# Patient Record
Sex: Female | Born: 1995 | Race: White | Hispanic: No | Marital: Married | Smoking: Never smoker
Health system: Southern US, Community
[De-identification: ages and names within clinical notes are randomized; demographics above are authoritative.]

## PROBLEM LIST (undated history)

## (undated) DIAGNOSIS — E079 Disorder of thyroid, unspecified: Secondary | ICD-10-CM

## (undated) DIAGNOSIS — D649 Anemia, unspecified: Secondary | ICD-10-CM

## (undated) HISTORY — PX: APPENDECTOMY: SHX54

## (undated) HISTORY — PX: TONSILLECTOMY: SUR1361

---

## 2018-11-19 LAB — OB RESULTS CONSOLE GC/CHLAMYDIA
Chlamydia: NEGATIVE
Gonorrhea: NEGATIVE

## 2018-11-19 LAB — OB RESULTS CONSOLE ABO/RH: RH Type: POSITIVE

## 2018-11-19 LAB — OB RESULTS CONSOLE ANTIBODY SCREEN: Antibody Screen: NEGATIVE

## 2018-11-19 LAB — OB RESULTS CONSOLE RUBELLA ANTIBODY, IGM: Rubella: IMMUNE

## 2018-11-19 LAB — OB RESULTS CONSOLE HIV ANTIBODY (ROUTINE TESTING): HIV: NONREACTIVE

## 2018-11-19 LAB — OB RESULTS CONSOLE HEPATITIS B SURFACE ANTIGEN: Hepatitis B Surface Ag: NEGATIVE

## 2018-11-19 LAB — OB RESULTS CONSOLE RPR: RPR: NONREACTIVE

## 2019-05-14 LAB — OB RESULTS CONSOLE GBS: GBS: NEGATIVE

## 2019-05-21 ENCOUNTER — Other Ambulatory Visit: Payer: Self-pay | Admitting: Obstetrics and Gynecology

## 2019-05-22 ENCOUNTER — Encounter (HOSPITAL_COMMUNITY): Payer: Self-pay | Admitting: *Deleted

## 2019-05-22 NOTE — Patient Instructions (Signed)
Bruce Johannes  05/22/2019   Your procedure is scheduled on:  05/28/2019  Arrive at [place] at Entrance C on [address] at [facility]  and [employer]. You are invited to use the FREE valet parking or use the Visitor's parking deck.  Pick up the phone at the desk and dial [phone].  Call this number if you have problems the morning of surgery: [phone]  Remember:   Do not eat food:(After Midnight) Desps de medianoche.  Do not drink clear liquids: (After Midnight) Desps de medianoche.  Take these medicines the morning of surgery with A SIP OF WATER:  none   Do not wear jewelry, make-up or nail polish.  Do not wear lotions, powders, or perfumes. Do not wear deodorant.  Do not shave 48 hours prior to surgery.  Do not bring valuables to the hospital.  [facility] is not   responsible for any belongings or valuables brought to the hospital.  Contacts, dentures or bridgework may not be worn into surgery.  Leave suitcase in the car. After surgery it may be brought to your room.  For patients admitted to the hospital, checkout time is 11:00 AM the day of              discharge.      Please read over the following fact sheets that you were given:     Preparing for Surgery

## 2019-05-26 ENCOUNTER — Other Ambulatory Visit (HOSPITAL_COMMUNITY)
Admission: RE | Admit: 2019-05-26 | Discharge: 2019-05-26 | Disposition: A | Discharge status: Home / Self Care | Payer: 59 | Source: Ambulatory Visit | Attending: Obstetrics and Gynecology | Admitting: Obstetrics and Gynecology

## 2019-05-26 ENCOUNTER — Other Ambulatory Visit: Payer: Self-pay

## 2019-05-26 DIAGNOSIS — Z20822 Contact with and (suspected) exposure to covid-19: Secondary | ICD-10-CM | POA: Insufficient documentation

## 2019-05-26 LAB — CBC
HCT: 29.4 % — ABNORMAL LOW (ref 36.0–46.0)
Hemoglobin: 9.6 g/dL — ABNORMAL LOW (ref 12.0–15.0)
MCH: 26.6 pg (ref 26.0–34.0)
MCHC: 32.7 g/dL (ref 30.0–36.0)
MCV: 81.4 fL (ref 80.0–100.0)
Platelets: 173 10*3/uL (ref 150–400)
RBC: 3.61 MIL/uL — ABNORMAL LOW (ref 3.87–5.11)
RDW: 13 % (ref 11.5–15.5)
WBC: 8.3 10*3/uL (ref 4.0–10.5)
nRBC: 0 % (ref 0.0–0.2)

## 2019-05-26 LAB — TYPE AND SCREEN
ABO/RH(D): O POS
Antibody Screen: NEGATIVE

## 2019-05-26 LAB — SARS CORONAVIRUS 2 (TAT 6-24 HRS): SARS Coronavirus 2: NEGATIVE

## 2019-05-26 LAB — RPR: RPR Ser Ql: NONREACTIVE

## 2019-05-27 LAB — ABO/RH: ABO/RH(D): O POS

## 2019-05-28 ENCOUNTER — Inpatient Hospital Stay (HOSPITAL_COMMUNITY): Payer: 59 | Admitting: Anesthesiology

## 2019-05-28 ENCOUNTER — Inpatient Hospital Stay (HOSPITAL_COMMUNITY)
Admission: RE | Admit: 2019-05-28 | Discharge: 2019-05-31 | DRG: 787 | Disposition: A | Discharge status: Home / Self Care | Payer: 59 | Attending: Obstetrics and Gynecology | Admitting: Obstetrics and Gynecology

## 2019-05-28 ENCOUNTER — Other Ambulatory Visit: Payer: Self-pay

## 2019-05-28 ENCOUNTER — Encounter (HOSPITAL_COMMUNITY): Payer: Self-pay | Admitting: Obstetrics and Gynecology

## 2019-05-28 ENCOUNTER — Encounter (HOSPITAL_COMMUNITY)
Admission: RE | Disposition: A | Discharge status: Home / Self Care | Payer: Self-pay | Source: Home / Self Care | Attending: Obstetrics and Gynecology

## 2019-05-28 DIAGNOSIS — O9081 Anemia of the puerperium: Secondary | ICD-10-CM | POA: Diagnosis not present

## 2019-05-28 DIAGNOSIS — O9902 Anemia complicating childbirth: Secondary | ICD-10-CM | POA: Diagnosis present

## 2019-05-28 DIAGNOSIS — O30033 Twin pregnancy, monochorionic/diamniotic, third trimester: Secondary | ICD-10-CM | POA: Diagnosis present

## 2019-05-28 DIAGNOSIS — Z98891 History of uterine scar from previous surgery: Secondary | ICD-10-CM

## 2019-05-28 DIAGNOSIS — Z3A36 36 weeks gestation of pregnancy: Secondary | ICD-10-CM

## 2019-05-28 DIAGNOSIS — O365930 Maternal care for other known or suspected poor fetal growth, third trimester, not applicable or unspecified: Secondary | ICD-10-CM | POA: Diagnosis present

## 2019-05-28 DIAGNOSIS — D62 Acute posthemorrhagic anemia: Secondary | ICD-10-CM | POA: Diagnosis not present

## 2019-05-28 DIAGNOSIS — O321XX2 Maternal care for breech presentation, fetus 2: Secondary | ICD-10-CM | POA: Diagnosis present

## 2019-05-28 DIAGNOSIS — O3090 Multiple gestation, unspecified, unspecified trimester: Secondary | ICD-10-CM | POA: Diagnosis present

## 2019-05-28 DIAGNOSIS — Z20822 Contact with and (suspected) exposure to covid-19: Secondary | ICD-10-CM | POA: Diagnosis present

## 2019-05-28 DIAGNOSIS — O365932 Maternal care for other known or suspected poor fetal growth, third trimester, fetus 2: Secondary | ICD-10-CM | POA: Diagnosis present

## 2019-05-28 DIAGNOSIS — O321XX1 Maternal care for breech presentation, fetus 1: Secondary | ICD-10-CM | POA: Diagnosis present

## 2019-05-28 SURGERY — Surgical Case
Anesthesia: Spinal | Site: Abdomen | Wound class: Clean Contaminated

## 2019-05-28 MED ORDER — SIMETHICONE 80 MG PO CHEW: 80.0000 mg | CHEWABLE_TABLET | ORAL | Status: DC | PRN

## 2019-05-28 MED ORDER — NALOXONE HCL 0.4 MG/ML IJ SOLN: 0.4000 mg | INTRAMUSCULAR | Status: DC | PRN

## 2019-05-28 MED ORDER — IBUPROFEN 800 MG PO TABS
800.0000 mg | ORAL_TABLET | Freq: Four times a day (QID) | ORAL | Status: DC
  Administered 2019-05-29 – 2019-05-31 (×8): 800 mg via ORAL
  Filled 2019-05-28 (×8): qty 1

## 2019-05-28 MED ORDER — PRENATAL MULTIVITAMIN CH
1.0000 | ORAL_TABLET | Freq: Every day | ORAL | Status: DC
  Administered 2019-05-29 – 2019-05-31 (×3): 1 via ORAL
  Filled 2019-05-28 (×4): qty 1

## 2019-05-28 MED ORDER — SIMETHICONE 80 MG PO CHEW
80.0000 mg | CHEWABLE_TABLET | Freq: Three times a day (TID) | ORAL | Status: DC
  Administered 2019-05-28 – 2019-05-30 (×4): 80 mg via ORAL
  Filled 2019-05-28 (×5): qty 1

## 2019-05-28 MED ORDER — ONDANSETRON HCL 4 MG/2ML IJ SOLN
INTRAMUSCULAR | Status: DC | PRN
  Administered 2019-05-28: 4 mg via INTRAVENOUS

## 2019-05-28 MED ORDER — KETOROLAC TROMETHAMINE 30 MG/ML IJ SOLN
INTRAMUSCULAR | Status: AC
  Filled 2019-05-28: qty 1

## 2019-05-28 MED ORDER — HYDROMORPHONE HCL 1 MG/ML IJ SOLN: 0.2500 mg | INTRAMUSCULAR | Status: DC | PRN

## 2019-05-28 MED ORDER — OXYTOCIN 40 UNITS IN NORMAL SALINE INFUSION - SIMPLE MED
INTRAVENOUS | Status: AC
  Filled 2019-05-28: qty 1000

## 2019-05-28 MED ORDER — ONDANSETRON HCL 4 MG/2ML IJ SOLN
INTRAMUSCULAR | Status: AC
  Filled 2019-05-28: qty 2

## 2019-05-28 MED ORDER — SODIUM CHLORIDE 0.9% FLUSH: 3.0000 mL | INTRAVENOUS | Status: DC | PRN

## 2019-05-28 MED ORDER — DEXAMETHASONE SODIUM PHOSPHATE 10 MG/ML IJ SOLN
INTRAMUSCULAR | Status: AC
  Filled 2019-05-28: qty 1

## 2019-05-28 MED ORDER — ONDANSETRON HCL 4 MG/2ML IJ SOLN: 4.0000 mg | Freq: Three times a day (TID) | INTRAMUSCULAR | Status: DC | PRN

## 2019-05-28 MED ORDER — DIBUCAINE (PERIANAL) 1 % EX OINT: 1.0000 "application " | TOPICAL_OINTMENT | CUTANEOUS | Status: DC | PRN

## 2019-05-28 MED ORDER — OXYTOCIN 40 UNITS IN NORMAL SALINE INFUSION - SIMPLE MED
INTRAVENOUS | Status: DC | PRN
  Administered 2019-05-28: 40 [IU] via INTRAVENOUS

## 2019-05-28 MED ORDER — CEFAZOLIN SODIUM-DEXTROSE 2-4 GM/100ML-% IV SOLN: 2.0000 g | INTRAVENOUS | Status: DC

## 2019-05-28 MED ORDER — MORPHINE SULFATE (PF) 0.5 MG/ML IJ SOLN
INTRAMUSCULAR | Status: AC
  Filled 2019-05-28: qty 10

## 2019-05-28 MED ORDER — MEPERIDINE HCL 25 MG/ML IJ SOLN
INTRAMUSCULAR | Status: AC
  Filled 2019-05-28: qty 1

## 2019-05-28 MED ORDER — MENTHOL 3 MG MT LOZG: 1.0000 | LOZENGE | OROMUCOSAL | Status: DC | PRN

## 2019-05-28 MED ORDER — BUPIVACAINE HCL (PF) 0.25 % IJ SOLN
INTRAMUSCULAR | Status: DC | PRN
  Administered 2019-05-28: 20 mL

## 2019-05-28 MED ORDER — LACTATED RINGERS IV SOLN: INTRAVENOUS | Status: DC

## 2019-05-28 MED ORDER — NALBUPHINE HCL 10 MG/ML IJ SOLN: 5.0000 mg | Freq: Once | INTRAMUSCULAR | Status: DC | PRN

## 2019-05-28 MED ORDER — NALBUPHINE HCL 10 MG/ML IJ SOLN: 5.0000 mg | INTRAMUSCULAR | Status: DC | PRN

## 2019-05-28 MED ORDER — CEFAZOLIN SODIUM-DEXTROSE 2-4 GM/100ML-% IV SOLN
INTRAVENOUS | Status: AC
  Filled 2019-05-28: qty 100

## 2019-05-28 MED ORDER — DIPHENHYDRAMINE HCL 25 MG PO CAPS: 25.0000 mg | ORAL_CAPSULE | Freq: Four times a day (QID) | ORAL | Status: DC | PRN

## 2019-05-28 MED ORDER — DIPHENHYDRAMINE HCL 25 MG PO CAPS: 25.0000 mg | ORAL_CAPSULE | ORAL | Status: DC | PRN

## 2019-05-28 MED ORDER — ZOLPIDEM TARTRATE 5 MG PO TABS: 5.0000 mg | ORAL_TABLET | Freq: Every evening | ORAL | Status: DC | PRN

## 2019-05-28 MED ORDER — COCONUT OIL OIL: 1.0000 "application " | TOPICAL_OIL | Status: DC | PRN

## 2019-05-28 MED ORDER — BUPIVACAINE IN DEXTROSE 0.75-8.25 % IT SOLN
INTRATHECAL | Status: DC | PRN
  Administered 2019-05-28: 1.5 mL via INTRATHECAL

## 2019-05-28 MED ORDER — SODIUM CHLORIDE 0.9 % IR SOLN
Status: DC | PRN
  Administered 2019-05-28: 1

## 2019-05-28 MED ORDER — MEPERIDINE HCL 25 MG/ML IJ SOLN: 6.2500 mg | INTRAMUSCULAR | Status: DC | PRN

## 2019-05-28 MED ORDER — OXYCODONE-ACETAMINOPHEN 5-325 MG PO TABS: 1.0000 | ORAL_TABLET | ORAL | Status: DC | PRN

## 2019-05-28 MED ORDER — SCOPOLAMINE 1 MG/3DAYS TD PT72
1.0000 | MEDICATED_PATCH | Freq: Once | TRANSDERMAL | Status: AC
  Administered 2019-05-28: 12:00:00 1.5 mg via TRANSDERMAL

## 2019-05-28 MED ORDER — PHENYLEPHRINE HCL-NACL 20-0.9 MG/250ML-% IV SOLN
INTRAVENOUS | Status: DC | PRN
  Administered 2019-05-28: 45 ug/min via INTRAVENOUS

## 2019-05-28 MED ORDER — NALOXONE HCL 4 MG/10ML IJ SOLN
1.0000 ug/kg/h | INTRAVENOUS | Status: DC | PRN
  Filled 2019-05-28: qty 5

## 2019-05-28 MED ORDER — SCOPOLAMINE 1 MG/3DAYS TD PT72
MEDICATED_PATCH | TRANSDERMAL | Status: AC
  Filled 2019-05-28: qty 1

## 2019-05-28 MED ORDER — METHYLERGONOVINE MALEATE 0.2 MG/ML IJ SOLN: 0.2000 mg | INTRAMUSCULAR | Status: DC | PRN

## 2019-05-28 MED ORDER — DIPHENHYDRAMINE HCL 50 MG/ML IJ SOLN: 12.5000 mg | INTRAMUSCULAR | Status: DC | PRN

## 2019-05-28 MED ORDER — OXYTOCIN 40 UNITS IN NORMAL SALINE INFUSION - SIMPLE MED: 2.5000 [IU]/h | INTRAVENOUS | Status: AC

## 2019-05-28 MED ORDER — MEPERIDINE HCL 25 MG/ML IJ SOLN
INTRAMUSCULAR | Status: DC | PRN
  Administered 2019-05-28 (×2): 12.5 mg via INTRAVENOUS

## 2019-05-28 MED ORDER — METHYLERGONOVINE MALEATE 0.2 MG PO TABS: 0.2000 mg | ORAL_TABLET | ORAL | Status: DC | PRN

## 2019-05-28 MED ORDER — TETANUS-DIPHTH-ACELL PERTUSSIS 5-2.5-18.5 LF-MCG/0.5 IM SUSP: 0.5000 mL | Freq: Once | INTRAMUSCULAR | Status: DC

## 2019-05-28 MED ORDER — MORPHINE SULFATE (PF) 0.5 MG/ML IJ SOLN
INTRAMUSCULAR | Status: DC | PRN
  Administered 2019-05-28: .15 mg via INTRATHECAL

## 2019-05-28 MED ORDER — FENTANYL CITRATE (PF) 100 MCG/2ML IJ SOLN
INTRAMUSCULAR | Status: DC | PRN
  Administered 2019-05-28: 15 ug via INTRATHECAL

## 2019-05-28 MED ORDER — KETOROLAC TROMETHAMINE 30 MG/ML IJ SOLN
30.0000 mg | Freq: Four times a day (QID) | INTRAMUSCULAR | Status: AC
  Administered 2019-05-28 – 2019-05-29 (×4): 30 mg via INTRAVENOUS
  Filled 2019-05-28 (×4): qty 1

## 2019-05-28 MED ORDER — PROMETHAZINE HCL 25 MG/ML IJ SOLN: 6.2500 mg | INTRAMUSCULAR | Status: DC | PRN

## 2019-05-28 MED ORDER — DEXAMETHASONE SODIUM PHOSPHATE 10 MG/ML IJ SOLN
INTRAMUSCULAR | Status: DC | PRN
  Administered 2019-05-28: 10 mg via INTRAVENOUS

## 2019-05-28 MED ORDER — LACTATED RINGERS IV SOLN
INTRAVENOUS | Status: DC
  Administered 2019-05-29: 1000 mL via INTRAVENOUS

## 2019-05-28 MED ORDER — STERILE WATER FOR IRRIGATION IR SOLN
Status: DC | PRN
  Administered 2019-05-28: 1

## 2019-05-28 MED ORDER — PHENYLEPHRINE HCL-NACL 20-0.9 MG/250ML-% IV SOLN
INTRAVENOUS | Status: AC
  Filled 2019-05-28: qty 250

## 2019-05-28 MED ORDER — SIMETHICONE 80 MG PO CHEW
80.0000 mg | CHEWABLE_TABLET | ORAL | Status: DC
  Administered 2019-05-29 (×2): 80 mg via ORAL
  Filled 2019-05-28 (×3): qty 1

## 2019-05-28 MED ORDER — SENNOSIDES-DOCUSATE SODIUM 8.6-50 MG PO TABS
2.0000 | ORAL_TABLET | ORAL | Status: DC
  Administered 2019-05-29 (×2): 2 via ORAL
  Filled 2019-05-28 (×3): qty 2

## 2019-05-28 MED ORDER — WITCH HAZEL-GLYCERIN EX PADS: 1.0000 "application " | MEDICATED_PAD | CUTANEOUS | Status: DC | PRN

## 2019-05-28 MED ORDER — FENTANYL CITRATE (PF) 100 MCG/2ML IJ SOLN
INTRAMUSCULAR | Status: AC
  Filled 2019-05-28: qty 2

## 2019-05-28 MED ORDER — CEFAZOLIN SODIUM-DEXTROSE 2-3 GM-%(50ML) IV SOLR
INTRAVENOUS | Status: DC | PRN
  Administered 2019-05-28: 2 g via INTRAVENOUS

## 2019-05-28 MED ORDER — KETOROLAC TROMETHAMINE 30 MG/ML IJ SOLN
30.0000 mg | Freq: Once | INTRAMUSCULAR | Status: AC | PRN
  Administered 2019-05-28: 12:00:00 30 mg via INTRAVENOUS

## 2019-05-28 MED ORDER — BUPIVACAINE HCL (PF) 0.25 % IJ SOLN
INTRAMUSCULAR | Status: AC
  Filled 2019-05-28: qty 30

## 2019-05-28 SURGICAL SUPPLY — 35 items
CHLORAPREP W/TINT 26ML (MISCELLANEOUS) ×3 IMPLANT
CLAMP CORD UMBIL (MISCELLANEOUS) IMPLANT
CLOTH BEACON ORANGE TIMEOUT ST (SAFETY) ×3 IMPLANT
DERMABOND ADVANCED (GAUZE/BANDAGES/DRESSINGS) ×2
DERMABOND ADVANCED .7 DNX12 (GAUZE/BANDAGES/DRESSINGS) ×1 IMPLANT
DRSG OPSITE POSTOP 4X10 (GAUZE/BANDAGES/DRESSINGS) ×3 IMPLANT
ELECT REM PT RETURN 9FT ADLT (ELECTROSURGICAL) ×3
ELECTRODE REM PT RTRN 9FT ADLT (ELECTROSURGICAL) ×1 IMPLANT
EXTRACTOR VACUUM M CUP 4 TUBE (SUCTIONS) IMPLANT
EXTRACTOR VACUUM M CUP 4' TUBE (SUCTIONS)
GLOVE BIO SURGEON STRL SZ7.5 (GLOVE) ×3 IMPLANT
GLOVE BIOGEL PI IND STRL 7.0 (GLOVE) ×1 IMPLANT
GLOVE BIOGEL PI INDICATOR 7.0 (GLOVE) ×2
GOWN STRL REUS W/TWL LRG LVL3 (GOWN DISPOSABLE) ×6 IMPLANT
KIT ABG SYR 3ML LUER SLIP (SYRINGE) IMPLANT
NEEDLE HYPO 22GX1.5 SAFETY (NEEDLE) ×3 IMPLANT
NEEDLE HYPO 25X5/8 SAFETYGLIDE (NEEDLE) IMPLANT
NEEDLE SPNL 20GX3.5 QUINCKE YW (NEEDLE) IMPLANT
NS IRRIG 1000ML POUR BTL (IV SOLUTION) ×3 IMPLANT
PACK C SECTION WH (CUSTOM PROCEDURE TRAY) ×3 IMPLANT
PENCIL SMOKE EVAC W/HOLSTER (ELECTROSURGICAL) ×3 IMPLANT
SUT MNCRL 0 VIOLET CTX 36 (SUTURE) ×2 IMPLANT
SUT MNCRL AB 3-0 PS2 27 (SUTURE) IMPLANT
SUT MON AB 2-0 CT1 27 (SUTURE) ×3 IMPLANT
SUT MON AB-0 CT1 36 (SUTURE) ×6 IMPLANT
SUT MONOCRYL 0 CTX 36 (SUTURE) ×4
SUT PLAIN 0 NONE (SUTURE) IMPLANT
SUT PLAIN 2 0 (SUTURE)
SUT PLAIN 2 0 XLH (SUTURE) IMPLANT
SUT PLAIN ABS 2-0 CT1 27XMFL (SUTURE) IMPLANT
SYR 20CC LL (SYRINGE) IMPLANT
SYR CONTROL 10ML LL (SYRINGE) ×3 IMPLANT
TOWEL OR 17X24 6PK STRL BLUE (TOWEL DISPOSABLE) ×3 IMPLANT
TRAY FOLEY W/BAG SLVR 14FR LF (SET/KITS/TRAYS/PACK) ×3 IMPLANT
WATER STERILE IRR 1000ML POUR (IV SOLUTION) ×3 IMPLANT

## 2019-05-28 NOTE — H&P (Signed)
Andrea [name] is a 23 y.o. female presenting for primary csection for IUGR twin B and malpresentation twin A and B at 36wks. OB History    Gravida  1   Para      Term      Preterm      AB      Living        SAB      TAB      Ectopic      Multiple      Live Births             No past medical history on file. Past Surgical History:  Procedure Laterality Date  . APPENDECTOMY     Family History: family history includes Heart attack in her maternal grandfather; Hypertension in her maternal grandfather; Lung cancer in her maternal grandmother; Thyroid disease in her maternal grandmother. Social History:  reports that she has never smoked. She has never used smokeless tobacco. No history on file for alcohol and drug.     Maternal Diabetes: No Genetic Screening: Normal Maternal Ultrasounds/Referrals: IUGR Fetal Ultrasounds or other Referrals:  None Maternal Substance Abuse:  No Significant Maternal Medications:  None Significant Maternal Lab Results:  Group B Strep negative Other Comments:  None  Review of Systems  Constitutional: Negative.   All other systems reviewed and are negative.  Maternal Medical History:  Reason for admission: Contractions.   Contractions: Onset was 1 week or more ago.   Frequency: irregular.   Perceived severity is mild.    Fetal activity: Perceived fetal activity is normal.   Last perceived fetal movement was within the past hour.    Prenatal complications: IUGR.   Prenatal Complications - Diabetes: none.      There were no vitals taken for this visit. Maternal Exam:  Uterine Assessment: Contraction strength is mild.  Contraction frequency is rare.   Abdomen: Patient reports no abdominal tenderness. Fetal presentation: breech  Introitus: Normal vulva. Normal vagina.  Ferning test: not done.  Nitrazine test: not done. Amniotic fluid character: not assessed.  Pelvis: adequate for delivery.   Cervix: Cervix evaluated by  digital exam.     Physical Exam  Nursing note and vitals reviewed. Constitutional: She is oriented to person, place, and time. She appears well-developed and well-nourished.  HENT:  Head: Normocephalic and atraumatic.  Cardiovascular: Normal rate and regular rhythm.  Respiratory: Effort normal and breath sounds normal.  GI: Soft. Bowel sounds are normal.  Genitourinary:    Vulva, vagina and uterus normal.   Musculoskeletal:        General: Normal range of motion.     Cervical back: Normal range of motion and neck supple.  Neurological: She is alert and oriented to person, place, and time. She has normal reflexes.  Skin: Skin is warm and dry.  Psychiatric: She has a normal mood and affect.    Prenatal labs: ABO, Rh: --/--/O POS, O POS Performed at [facility], [place] [address], [place], [place] [address]  [phone]) Antibody: NEG (02/08 0857) Rubella: Immune (08/04 0000) RPR: NON REACTIVE (02/08 0853)  HBsAg: Negative (08/04 0000)  HIV: Non-reactive (08/04 0000)  GBS: Negative/-- (01/27 0000)   Assessment/Plan: 36wk Mono C Di A twin IUP IUGR twin B Malpresentation twin A and B Primary csecton. Consent done . Risks vs benefits of surgery discussed.   Andrea [name] J 05/28/2019, 8:00 AM

## 2019-05-28 NOTE — H&P (Deleted)
  The note originally documented on this encounter has been moved the the encounter in which it belongs.  

## 2019-05-28 NOTE — Anesthesia Preprocedure Evaluation (Signed)
Anesthesia Evaluation  Patient identified by MRN, date of birth, ID band Patient awake    Reviewed: Allergy & Precautions, NPO status , Patient's Chart, lab work & pertinent test results  Airway Mallampati: II  TM Distance: >3 FB Neck ROM: Full    Dental no notable dental hx. (+) Dental Advisory Given, Teeth Intact   Pulmonary neg pulmonary ROS,    Pulmonary exam normal breath sounds clear to auscultation       Cardiovascular negative cardio ROS Normal cardiovascular exam Rhythm:Regular Rate:Normal     Neuro/Psych negative neurological ROS  negative psych ROS   GI/Hepatic negative GI ROS, Neg liver ROS,   Endo/Other  negative endocrine ROS  Renal/GU negative Renal ROS     Musculoskeletal negative musculoskeletal ROS (+)   Abdominal   Peds  Hematology negative hematology ROS (+)   Anesthesia Other Findings   Reproductive/Obstetrics (+) Pregnancy                             Anesthesia Physical Anesthesia Plan  ASA: II  Anesthesia Plan: Spinal   Post-op Pain Management:    Induction: Intravenous  PONV Risk Score and Plan: 4 or greater and Ondansetron, Dexamethasone, Scopolamine patch - Pre-op and Treatment may vary due to age or medical condition  Airway Management Planned: Natural Airway  Additional Equipment:   Intra-op Plan:   Post-operative Plan:   Informed Consent: I have reviewed the patients History and Physical, chart, labs and discussed the procedure including the risks, benefits and alternatives for the proposed anesthesia with the patient or authorized representative who has indicated his/her understanding and acceptance.     Dental advisory given  Plan Discussed with: CRNA  Anesthesia Plan Comments:         Anesthesia Quick Evaluation

## 2019-05-28 NOTE — Transfer of Care (Signed)
Immediate Anesthesia Transfer of Care Note  Patient: Andrea [name]  Procedure(s) Performed: CESAREAN SECTION MULTI-GESTATIONAL (N/A Abdomen)  Patient Location: PACU  Anesthesia Type:Spinal  Level of Consciousness: awake, alert , oriented and patient cooperative  Airway & Oxygen Therapy: Patient Spontanous Breathing  Post-op Assessment: Report given to RN and Post -op Vital signs reviewed and stable  Post vital signs: Reviewed and stable  Last Vitals:  Vitals Value Taken Time  BP 118/72 05/28/19 1202  Temp    Pulse 84 05/28/19 1206  Resp 19 05/28/19 1206  SpO2 100 % 05/28/19 1206  Vitals shown include unvalidated device data.  Last Pain:  Vitals:   05/28/19 0851  TempSrc: Oral         Complications: No apparent anesthesia complications

## 2019-05-28 NOTE — Op Note (Signed)
Cesarean Section Procedure Note  Indications: malpresentation: twin A and B and IUGR twin B  Mono Chorionic Di amniotic twin gestation  Pre-operative Diagnosis: 36 week 0 day pregnancy.  Post-operative Diagnosis: same  Surgeon: Lovenia Kim   Assistants: Claudette Laws, CNM  Anesthesia: Local anesthesia 0.25.% bupivacaine and Spinal anesthesia  ASA Class: 2  Procedure Details  The patient was seen in the Holding Room. The risks, benefits, complications, treatment options, and expected outcomes were discussed with the patient.  The patient concurred with the proposed plan, giving informed consent. The risks of anesthesia, infection, bleeding and possible injury to other organs discussed. Injury to bowel, bladder, or ureter with possible need for repair discussed. Possible need for transfusion with secondary risks of hepatitis or HIV acquisition discussed. Post operative complications to include but not limited to DVT, PE and Pneumonia noted. The site of surgery properly noted/marked. The patient was taken to Operating Room # A, identified as Andrea [name] and the procedure verified as C-Section Delivery. A Time Out was held and the above information confirmed.  After induction of anesthesia, the patient was draped and prepped in the usual sterile manner. A Pfannenstiel incision was made and carried down through the subcutaneous tissue to the fascia. Fascial incision was made and extended transversely using Mayo scissors. The fascia was separated from the underlying rectus tissue superiorly and inferiorly. The peritoneum was identified and entered. Peritoneal incision was extended longitudinally. The utero-vesical peritoneal reflection was incised transversely and the bladder flap was bluntly freed from the lower uterine segment. A low transverse uterine incision(Kerr hysterotomy) was made. Delivered from transverse converted to footling breech  presentation was a  Female twin A  with Apgar scores of 8 at  one minute and 9 at five minutes. Bulb suctioning gently performed. Neonatal team in attendance.After the umbilical cord was clamped and cut cord blood was obtained for evaluation.  Delivered from transverse converted to transverse converted to vertex  presentation was a  female twin B  with Apgar scores of 8 at one minute and 9 at five minutes. Bulb suctioning gently performed. Neonatal team in attendance.After the umbilical cord was clamped and cut cord blood was obtained for evaluationThe placenta was removed intact and appeared normal. The uterus was curetted with a dry lap pack. Good hemostasis was noted.The uterine outline, tubes and ovaries appeared normal. The uterine incision was closed with running locked sutures of 0 Monocryl x 2 layers. Hemostasis was observed. Lavage was carried out until clear.The parietal peritoneum was closed with a running 2-0 Monocryl suture. The fascia was then reapproximated with running sutures of 0 Monocryl. The skin was reapproximated with 3-0 monocryl after [place] closure with 2-0 plain was done.  Instrument, sponge, and needle counts were correct prior the abdominal closure and at the conclusion of the case.   Findings: As noted above  Estimated Blood Loss:  500         Drains: foley                 Specimens: placenta                 Complications:  None; patient tolerated the procedure well.         Disposition: PACU - hemodynamically stable.         Condition: stable  Attending Attestation: I performed the procedure.

## 2019-05-28 NOTE — Anesthesia Procedure Notes (Signed)
Spinal  Patient location during procedure: OR Staffing Anesthesiologist: Nader Boys, MD Preanesthetic Checklist Completed: patient identified, IV checked, site marked, risks and benefits discussed, surgical consent, monitors and equipment checked, pre-op evaluation and timeout performed Spinal Block Patient position: sitting Prep: DuraPrep and site prepped and draped Patient monitoring: heart rate, cardiac monitor, continuous pulse ox and blood pressure Approach: midline Location: L3-4 Injection technique: single-shot Needle Needle type: Sprotte  Needle gauge: 24 G Needle length: 9 cm Assessment Sensory level: T4     

## 2019-05-28 NOTE — Lactation Note (Signed)
This note was copied from a baby's chart. Lactation Consultation Note  Patient Name: Andrea [name] Date: 05/28/2019 Reason for consult: Initial assessment;Primapara;1st time breastfeeding;Late-preterm 34-36.6wks;Multiple gestation  92 - 1650 - I conducted an initial lactation visit with Andrea [name]. Prior to enterign the room, I encountered the assigned RN, Brooke. She updated me on the status of the 4 hour old twins. Baby A latched to the breast one time, and Baby B attempted, but did not achieve a full latch. The LPI protocol was reviewed, and Andrea [name] received the LPI handout. RN helped her hand express 5 mls and spoon feed babies her EBM. Babies then received 10 mls of Sim 22 about 30 minutes prior to my consult as per LPI supplementation guidelines.  RN was about to set up the DEBP to help Andrea [name] initiate pumping, and I volunteered to do so with my P2P lactation student.  I entered the room and conducted an initial inventory with the patient. She had positive breast changes in pregnacy. She has a personal pump (S2) on the way from her insurance provider. She has a history of multiple gestation in her family and has support at home. Her significant other was in the room with her and appeared attentive and ivolved.   We offered to set up the DEBP, and she consented. My P2P student provided education on dissembly, cleaning and reassembly of pump parts and helped her with beginning the intiate pattern on the DEBP.   We educated at the bedside while she pumped regaring what to expect in the first three days of breast feeding and differences between colostrum and mature milk. We encouraged her to hold babies STS often and hand express/pump frequently and provide EBM to babies.  Andrea [name] quickly filled up the two 35 ml storage containers, and we switched them out mid-pump. She went on to express an additional 5 mls per breast for a total of approximately 80 mls. I praised her for  this accomplishment and encouraged her to use this milk at the next feeding. We reviwed supplementation amount for day 1 and milk storage.   When I left the room, I updated the RN Jerene Pitch and asked the MUR to please order some storage labels.  The plan is to follow the LPI guidelines of offering the breast first on demand 8-12 times a day. Then supplement and pump. I recommended pumping 8 times a day.  All questions answered at this time.   Maternal Data Has patient been taught Hand Expression?: Yes Does the patient have breastfeeding experience prior to this delivery?: No  Feeding Feeding Type: Formula Nipple Type: Slow - flow  LATCH Score Latch: Too sleepy or reluctant, no latch achieved, no sucking elicited.  Audible Swallowing: None  Type of Nipple: Everted at rest and after stimulation  Comfort (Breast/Nipple): Soft / non-tender  Hold (Positioning): Assistance needed to correctly position infant at breast and maintain latch.  LATCH Score: 5  Interventions Interventions: Breast feeding basics reviewed;DEBP  Lactation Tools Discussed/Used Tools: Pump Breast pump type: Double-Electric Breast Pump Pump Review: Setup, frequency, and cleaning;Milk Storage Initiated by:: hl Date initiated:: 05/28/19   Consult Status Consult Status: Follow-up Date: 05/29/19 Follow-up type: In-patient    Lenore Manner 05/28/2019, 6:12 PM

## 2019-05-29 DIAGNOSIS — O9902 Anemia complicating childbirth: Secondary | ICD-10-CM | POA: Diagnosis present

## 2019-05-29 LAB — CBC
HCT: 23 % — ABNORMAL LOW (ref 36.0–46.0)
Hemoglobin: 7.1 g/dL — ABNORMAL LOW (ref 12.0–15.0)
MCH: 26.2 pg (ref 26.0–34.0)
MCHC: 30.9 g/dL (ref 30.0–36.0)
MCV: 84.9 fL (ref 80.0–100.0)
Platelets: 180 10*3/uL (ref 150–400)
RBC: 2.71 MIL/uL — ABNORMAL LOW (ref 3.87–5.11)
RDW: 13.4 % (ref 11.5–15.5)
WBC: 17.1 10*3/uL — ABNORMAL HIGH (ref 4.0–10.5)
nRBC: 0.1 % (ref 0.0–0.2)

## 2019-05-29 MED ORDER — LACTATED RINGERS IV BOLUS
300.0000 mL | Freq: Once | INTRAVENOUS | Status: AC
  Administered 2019-05-29: 300 mL via INTRAVENOUS

## 2019-05-29 MED ORDER — SODIUM CHLORIDE 0.9 % IV SOLN
1000.0000 mg | Freq: Once | INTRAVENOUS | Status: AC
  Administered 2019-05-29: 1000 mg via INTRAVENOUS
  Filled 2019-05-29: qty 20

## 2019-05-29 MED ORDER — POLYSACCHARIDE IRON COMPLEX 150 MG PO CAPS
150.0000 mg | ORAL_CAPSULE | Freq: Every day | ORAL | Status: DC
  Administered 2019-05-30: 150 mg via ORAL
  Filled 2019-05-29 (×2): qty 1

## 2019-05-29 MED ORDER — SODIUM CHLORIDE 0.9 % IV SOLN
25.0000 mg | Freq: Once | INTRAVENOUS | Status: AC
  Administered 2019-05-29: 10:00:00 25 mg via INTRAVENOUS
  Filled 2019-05-29: qty 0.5

## 2019-05-29 MED ORDER — MAGNESIUM OXIDE 400 (241.3 MG) MG PO TABS
400.0000 mg | ORAL_TABLET | Freq: Every day | ORAL | Status: DC
  Administered 2019-05-29 – 2019-05-30 (×2): 400 mg via ORAL
  Filled 2019-05-29 (×3): qty 1

## 2019-05-29 NOTE — Progress Notes (Signed)
Patient ID: Andrea [name], female   DOB: May 18, 1995, 24 y.o.   MRN: [id number] Subjective: POD# 1   Andrea, [name] [id number]  Live born female Andrea [name] Birth Weight: 6 lb 5.4 oz (2875 g) APGAR: 8, 8  Newborn Delivery   Birth date/time: 05/28/2019 11:17:00 Delivery type: C-Section, Low Transverse C-section categorization: Primary       Andrea, [name] [id number]  Live born female Andrea [name] Birth Weight: 4 lb 9 oz (2070 g) APGAR: 8, 8  Newborn Delivery   Birth date/time: 05/[birth year]/2019 11:17:00 Delivery type: C-Section, Low Transverse Trial of labor: No C-section categorization: Primary    Delivering provider:    [name], [name] [id number]  Andrea, [name] Andrea [name] [id number]  Andrea [name]    circumcision planned for Andrea [name]: breast and bottle  Pain control at delivery:    Andrea, [name] [id number]  Spinal    Andrea, [name] [id number]  Spinal    Reports feeling well.  Patient reports tolerating PO.   Breast symptoms:pumping colostrum and Andrea latched, Gean [name] still working on it. Pain controlled with Toradol Denies HA/SOB/C/P/N/V/dizziness. Flatus present. She reports vaginal bleeding as normal, without clots.  She has not ambulated yet, stood at [facility], foley cath in place. Objective:   VS:    Vitals:   05/28/19 2059 05/29/19 0018 05/29/19 0505 05/29/19 0811  BP: 111/80 108/81 105/72 116/82  Pulse: (!) 57 (!) 55 62 (!) 55  Resp: 19 18 19    Temp: 97.9 F (36.6 C) 97.7 F (36.5 C) 98 F (36.7 C) 98.2 F (36.8 C)  TempSrc: Oral Axillary Oral Oral  SpO2: 99% 99% 98% 99%  Weight:      Height:          Intake/Output Summary (Last 24 hours) at 05/29/2019 0857 Last data filed at 05/29/2019 0500 Gross per 24 hour  Intake 3422.5 ml  Output 1065 ml  Net 2357.5 ml        Recent Labs    05/29/19 0612  WBC 17.1*  HGB 7.1*  HCT 23.0*  PLT 180     Blood type: --/--/O POS, O POS Performed at [facility] [address], [place], [place] [address]  [phone] [id number])  Rubella: Immune (08/04 0000)  Vaccines: TDaP UTD         Flu    declined   Physical Exam:  General: alert, cooperative and no distress CV: Regular rate and rhythm Resp: clear Abdomen: soft, nontender, normal bowel sounds Incision: clean, dry and intact Uterine Fundus: firm, below umbilicus, nontender Lochia: moderate Ext: edema + 1 pedal      Assessment/Plan: 24 y.o.   POD# 1. [id number]                  Principal Problem:   Postpartum care following cesarean delivery (2/10) Active Problems:   Multiple gestation   S/P cesarean section - MoDi twins, 36 wks, malpresentation   Maternal anemia, with delivery  -           ABL Anemia compounding chronic IDA                         - IV iron dextran 1gm dose now                         - Start Niferex 150mg  PO daily tomorrow                         -  Magnesium oxide 400mg  PO daily    Doing well, stable.     DC foley this am          Advance diet as tolerated Encourage rest when baby rests Breastfeeding support Encourage to ambulate, warm fluids for gut motility Routine post-op care  Andrea [name], CNM, MSN 05/29/2019, 8:57 AM

## 2019-05-29 NOTE — Lactation Note (Signed)
This note was copied from a baby's chart. Lactation Consultation Note  Patient Name: Andrea [name] Steinmeyer [id number] Date: 05/29/2019 Reason for consult: Follow-up assessment;Primapara;1st time breastfeeding;Late-preterm 34-36.6wks  1623 - 1630 - I conducted a follow up lactation consult with Ms. Tostenson. The RN and RN student were in the room at this time. Baby A was on Ms. Caracci's chest, and her support person was holding Baby B on his chest.  Ms. Landherr reports that she has used her breast pump 3 times today. She used up her pumped milk overnight and then has been supplementing with Sim 22 today. She states that she isn't seeing as much milk coming out with the pump today. I provided reassurance that this is normal, and I recommended that she pump every three hours or 8 times a day.  Ms. Nakao reports that she was not able to get much rest overnight. Both babies have had output today. I offered to return at a quieter time to assist with breast feeding and to offer assistance with pumping. I encouraged her to call as needed.  The plan today is to continue with lots of STS, attempt to breast feed, and to supplement either her EBM or formula (Ms. Abrahamsen prefers formula over donor milk). I also encouraged her to be consistent with pumping 8 times a day.   Maternal Data Has patient been taught Hand Expression?: Yes Does the patient have breastfeeding experience prior to this delivery?: No  Feeding Nipple Type: Slow - flow   Interventions Interventions: Breast feeding basics reviewed  Lactation Tools Discussed/Used Pump Review: Setup, frequency, and cleaning   Consult Status Consult Status: Follow-up Date: 05/30/19 Follow-up type: In-patient    Lenore Manner 05/29/2019, 5:42 PM

## 2019-05-29 NOTE — Progress Notes (Signed)
Patients output was minimal, with dark yellow urine. Urine started to turn lighter with more water she drunk and LR going, but patients output was still minimal. Called Mid-wife on call and she ordered 300 LR bolus and to continue to watch patients output with foley catheter til output increases. Mid-wife stated her team will reassess her output when they come to the floor. Will continue to monitor patient.

## 2019-05-30 LAB — SURGICAL PATHOLOGY

## 2019-05-30 MED ORDER — ACETAMINOPHEN 500 MG PO TABS
1000.0000 mg | ORAL_TABLET | Freq: Four times a day (QID) | ORAL | Status: DC | PRN
  Administered 2019-05-30 – 2019-05-31 (×3): 1000 mg via ORAL
  Filled 2019-05-30 (×3): qty 2

## 2019-05-30 NOTE — Anesthesia Postprocedure Evaluation (Signed)
Anesthesia Post Note  Patient: Andrea [name]  Procedure(s) Performed: CESAREAN SECTION MULTI-GESTATIONAL (N/A Abdomen)     Patient location during evaluation: PACU Anesthesia Type: Spinal Level of consciousness: awake and alert Pain management: pain level controlled Vital Signs Assessment: post-procedure vital signs reviewed and stable Respiratory status: spontaneous breathing Cardiovascular status: stable Postop Assessment: spinal receding Anesthetic complications: no    Last Vitals:  Vitals:   05/29/19 2208 05/30/19 0629  BP: 109/66 107/71  Pulse: 73 75  Resp: 18 19  Temp:  36.8 C  SpO2:  98%    Last Pain:  Vitals:   05/30/19 0717  TempSrc:   PainSc: [place]

## 2019-05-30 NOTE — Progress Notes (Signed)
SVD: primary  S:  Pt reports feeling well/ Tolerating po/ Voiding without problems/ No n/v/ Bleeding is moderate/ Pain controlled withnarcotic analgesics including oxycodone/acetaminophen (Percocet, Tylox)    O:  A & O x 3 / VS: Blood pressure 107/71, pulse 75, temperature 98.3 F (36.8 C), temperature source Oral, resp. rate 19, height 5\' 6"  (1.676 m), weight 101.9 kg, SpO2 98 %, unknown if currently breastfeeding.  LABS: No results found for this or any previous visit (from the past 24 hour(s)).  I&O: I/O last 3 completed shifts: In: 2980 [P.O.:2410; IV Piggyback:570] Out: [id number] [Urine:1810]   No intake/output data recorded.  Breast soft pendulous  Lungs: clear to A  Heart: regular rate and rhythm, S1, S2 normal, no murmur, click, rub or gallop  Abdomen: soft obese uterus at umb surgical tender Primary dressing honey comb d/c/i  Perineum: is normal  Lochia: moderate  Extremities:no redness or tenderness in the calves or thighs, edema 1+    A/P: POD # 2/PPD #2/ [id number]  Doing well  Continue routine post partum orders  Working with lactation

## 2019-05-31 ENCOUNTER — Other Ambulatory Visit (HOSPITAL_COMMUNITY): Admission: RE | Admit: 2019-05-31 | Payer: 59 | Source: Ambulatory Visit

## 2019-05-31 MED ORDER — ACETAMINOPHEN 500 MG PO TABS: 1000.0000 mg | ORAL_TABLET | Freq: Four times a day (QID) | ORAL | 1 refills | Status: DC | PRN

## 2019-05-31 MED ORDER — SENNOSIDES-DOCUSATE SODIUM 8.6-50 MG PO TABS: 2.0000 | ORAL_TABLET | Freq: Every evening | ORAL | 1 refills | Status: DC | PRN

## 2019-05-31 MED ORDER — POLYSACCHARIDE IRON COMPLEX 150 MG PO CAPS: 150.0000 mg | ORAL_CAPSULE | Freq: Every day | ORAL | 1 refills | Status: DC

## 2019-05-31 MED ORDER — MAGNESIUM OXIDE 400 (241.3 MG) MG PO TABS: 400.0000 mg | ORAL_TABLET | Freq: Two times a day (BID) | ORAL | 1 refills | Status: DC

## 2019-05-31 MED ORDER — IBUPROFEN 800 MG PO TABS: 800.0000 mg | ORAL_TABLET | Freq: Four times a day (QID) | ORAL | 1 refills | Status: DC

## 2019-05-31 NOTE — Lactation Note (Addendum)
This note was copied from a baby's chart. Lactation Consultation Note  Patient Name: Andrea [name] [id number] Date: 05/31/2019   Twins 28 hours old. [redacted]w[redacted]d.   Mother is following LPI feeding plan. Encouraged her to call for OP lactation appt is approx 1 week.  She states she allows infants to attempt breastfeeding but after 10 min if they are not latched strongly, she is supplementing. She states Baby Girl B has a weaker suck.  She is smaller.  She will continue to allow them to lick and learn. Mother has not pumped since last night.  She has DEBP at home. Encouraged her to pump when she is able with a goal of 8 times per day if possible. She states babies were more active at the breast initally but now are sleepy.   Provided education regarding normal feeding behavior for LPI infants. Parents know to increase supplementation per day of life and as infants desire. Feed on demand with cues at least q 3-4 hours. Place baby STS if not cueing.  Reviewed engorgement care and monitoring voids/stools.      Maternal Data    Feeding Feeding Type: Bottle Fed - Formula Nipple Type: Slow - flow  LATCH Score                   Interventions    Lactation Tools Discussed/Used     Consult Status      Carlye Grippe 05/31/2019, 10:41 AM

## 2019-05-31 NOTE — Discharge Summary (Signed)
OB Discharge Summary  Patient Name: Andrea [name] DOB: 17-Nov-1995 MRN: [id number]  Date of admission: 05/28/2019 Delivering MD:    Blase Mess Arrin [id number]  Lizanne, Rei [id number]  [name]    Date of discharge: 05/31/2019  Admitting diagnosis: Multiple gestation [O30.90] Intrauterine pregnancy: [redacted]w[redacted]d     Secondary diagnosis:Principal Problem:   Postpartum care following cesarean delivery (2/10) Active Problems:   Multiple gestation   S/P cesarean section - MoDi twins, 36 wks, malpresentation   Maternal anemia, with delivery  Additional problems:none     Discharge diagnosis: Kathi Der twin gestation, status post primary cesarean section for malpresentation baby A                                                                     Post partum procedures:None  Augmentation: None, status post primary cesarean section  Complications: None  Hospital course:  Sceduled C/S   24 y.o. yo G1P0102 at [redacted]w[redacted]d was admitted to the hospital 05/28/2019 for scheduled cesarean section with the following indication:Malpresentation and Multifetal Gestation.  Membrane Rupture Time/Date:    Lizete, Morcom [id number]  11:16 AM    Jakira, Locklin [id number]  11:17 AM  ,   Brittlynn, Chanda [id number]  05/28/2019    Adisynn, Arostegui [id number]  05/28/2019    Patient delivered a Viable infant.   Pamler, Welle [id number]  05/28/2019    Aryia, Rini [id number]  05/28/2019   Details of operation can be found in separate operative note.  Pateint had an uncomplicated postpartum course.  She is ambulating, tolerating a regular diet, passing flatus, and urinating well. Patient is discharged home in stable condition on  05/31/19         Physical exam  Vitals:   05/30/19 0629 05/30/19 1533 05/30/19 2131 05/31/19 0531  BP: 107/71 123/86 121/74 124/86  Pulse: 75 87 80 73  Resp: 19 18 18 18   Temp: 98.3 F (36.8 C) 97.7 F (36.5  C) 98 F (36.7 C) 98.1 F (36.7 C)  TempSrc: Oral Oral Oral Oral  SpO2: 98%  98% 98%  Weight:      Height:       General: alert, cooperative and no distress Lochia: appropriate Uterine Fundus: firm Incision: Healing well with no significant drainage DVT Evaluation: No evidence of DVT seen on physical exam. Labs: Lab Results  Component Value Date   WBC 17.1 (H) 05/29/2019   HGB 7.1 (L) 05/29/2019   HCT 23.0 (L) 05/29/2019   MCV 84.9 05/29/2019   PLT 180 05/29/2019   No flowsheet data found.  Discharge instruction: per After Visit Summary and "Baby and Me Booklet".  After Visit Meds:  Allergies as of 05/31/2019      Reactions   Codeine Shortness Of Breath, Rash      Medication List    TAKE these medications   acetaminophen 500 MG tablet Commonly known as: TYLENOL Take 2 tablets (1,000 mg total) by mouth every 6 (six) hours as needed for mild pain.   esomeprazole 20 MG capsule Commonly known as: NEXIUM Take 20 mg by mouth 2 (two) times daily before a meal.   ibuprofen 800 MG tablet Commonly  known as: ADVIL Take 1 tablet (800 mg total) by mouth every 6 (six) hours.   iron polysaccharides 150 MG capsule Commonly known as: NIFEREX Take 1 capsule (150 mg total) by mouth daily. Start taking on: June 01, 2019   magnesium oxide 400 (241.3 Mg) MG tablet Commonly known as: MAG-OX Take 1 tablet (400 mg total) by mouth 2 (two) times daily.   ondansetron 4 MG disintegrating tablet Commonly known as: ZOFRAN-ODT Take 4 mg by mouth every 8 (eight) hours as needed for nausea or vomiting.   senna-docusate 8.6-50 MG tablet Commonly known as: Senokot-S Take 2 tablets by mouth at bedtime as needed for mild constipation.       Diet: routine diet  Activity: Advance as tolerated. Pelvic rest for 6 weeks.   Outpatient follow up:6 weeks Follow up Appt:No future appointments. Follow up visit: No follow-ups on file.  Postpartum contraception: Not Discussed  Newborn  Data:   Cosma, Pollino [id number]  Live born female  Birth Weight: 6 lb 5.4 oz (2875 g) APGAR: 84, 8  Newborn Delivery   Birth date/time: 05/28/2019 11:17:00 Delivery type: C-Section, Low Transverse C-section categorization: Primary       Saquana, Maruska [id number]  Live born female  Birth Weight: 4 lb 9 oz (2070 g) APGAR: 8, 8  Newborn Delivery   Birth date/time: 05/28/2019 11:17:00 Delivery type: C-Section, Low Transverse Trial of labor: No C-section categorization: Primary      Baby Feeding: Bottle and Breast Disposition:home with mother   05/31/2019 Ala Dach, MD

## 2019-06-02 ENCOUNTER — Encounter (HOSPITAL_COMMUNITY)
Admission: RE | Disposition: A | Discharge status: Home / Self Care | Payer: Self-pay | Source: Home / Self Care | Attending: Obstetrics and Gynecology

## 2019-06-02 SURGERY — Surgical Case
Anesthesia: Spinal

## 2019-06-16 ENCOUNTER — Inpatient Hospital Stay (HOSPITAL_COMMUNITY): Admit: 2019-06-16 | Payer: Self-pay | Admitting: Obstetrics and Gynecology

## 2020-03-09 ENCOUNTER — Ambulatory Visit: Payer: Self-pay | Admitting: Surgery

## 2020-03-10 NOTE — Progress Notes (Addendum)
COVID Vaccine Completed: N/A Date COVID Vaccine completed: N/A COVID vaccine manufacturer: N/A   PCP - Dr Clydie Braun Cardiologist - N/A  Chest x-ray - N/A EKG - N/A Stress Test - N/A ECHO - N/A Cardiac Cath - N/A Pacemaker/ICD device last checked:N/A  Sleep Study - N/A CPAP - N/A  Fasting Blood Sugar - N/A Checks Blood Sugar _N/A____ times a day  Blood Thinner Instructions: N/A Aspirin Instructions: N/A Last Dose: N/A  Activity leve: Able to exercise without symptoms     Anesthesia review: N/A  Patient denies shortness of breath, fever, cough and chest pain at PAT appointment   Patient verbalized understanding of instructions that were given to them at the PAT appointment. Patient was also instructed that they will need to review over the PAT instructions again at home before surgery.

## 2020-03-14 DIAGNOSIS — D440 Neoplasm of uncertain behavior of thyroid gland: Secondary | ICD-10-CM | POA: Diagnosis present

## 2020-03-14 NOTE — H&P (Signed)
General Surgery [facility]  Andrea [name] DOB: 06/17/1995 Married / Language: English / Race: White Female   History of Present Illness   The patient is a 24 year old female who presents with a thyroid nodule.  CHIEF COMPLAINT: thyroid neoplasm of uncertain behavior  Patient is referred by Clydie Braun, NP, for surgical evaluation and management of a newly diagnosed thyroid neoplasm of uncertain behavior. Patient found a mass in the anterior neck in May 2021. She demonstrated this to her primary care provider. She underwent ultrasound examination on February 05, 2020. This showed an enlarged left thyroid lobe with a dominant nodule measuring 3.6 cm in size. This was felt to be mildly suspicious and fine-needle aspiration biopsy was recommended. Right thyroid lobe appeared normal. Lymph nodes appeared normal. Fine-needle aspiration biopsy cytopathology showed atypia of undetermined significance, Bethesda category III, on February 17, 2020. 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 Recommendation is for total thyroidectomy due to a significant risk of recurrence. Patient presents today to discuss thyroid surgery. She is accompanied by her husband. She has no prior history of thyroid disease. She has never been on thyroid medication. There is a family history of hypothyroidism and a maternal grandmother. There is no family history of thyroid malignancy. There is no family history of other endocrine neoplasms. Patient is married with twins several born in February 2021. She works as a Theme park manager.   Past Surgical History  Appendectomy  Cesarean Section - 1  Tonsillectomy   Diagnostic Studies History  Colonoscopy  never Mammogram  within last year Pap Smear  1-5 years ago  Allergies   Codeine Sulfate *ANALGESICS - OPIOID*  Allergies Reconciled   Medication History  No Current Medications Medications Reconciled  Social History  No alcohol use  No drug use  Tobacco use  Never smoker.  Family History Breast Cancer  Family Members In General. Cancer  Family Members In General. Thyroid problems  Family Members In General.  Pregnancy / Birth History  Age at menarche  79 years. Contraceptive History  Intrauterine device. Gravida  1 Irregular periods  Maternal age  63-25 Para  1  Review of Systems  General Not Present- Appetite Loss, Chills, Fatigue, Fever, Night Sweats, Weight Gain and Weight Loss. Skin Not Present- Change in Wart/Mole, Dryness, Hives, Jaundice, New Lesions, Non-Healing Wounds, Rash and Ulcer. HEENT Present- Wears glasses/contact lenses. Not Present- Earache, Hearing Loss, Hoarseness, Nose Bleed, Oral Ulcers, Ringing in the Ears, Seasonal Allergies, Sinus Pain, Sore Throat, Visual Disturbances and Yellow Eyes. Respiratory Not Present- Bloody sputum, Chronic Cough, Difficulty Breathing, Snoring and Wheezing. Breast Not Present- Breast Mass, Breast Pain, Nipple Discharge and Skin Changes. Cardiovascular Not Present- Chest Pain, Difficulty Breathing Lying Down, Leg Cramps, Palpitations, Rapid Heart Rate, Shortness of Breath and Swelling of Extremities. Gastrointestinal Not Present- Abdominal Pain, Bloating, Bloody Stool, Change in Bowel Habits, Chronic diarrhea, Constipation, Difficulty Swallowing, Excessive gas, Gets full quickly at meals, Hemorrhoids, Indigestion, Nausea, Rectal Pain and Vomiting. Female Genitourinary Not Present- Frequency, Nocturia, Painful Urination, Pelvic Pain and Urgency. Musculoskeletal Not Present- Back Pain, Joint Pain, Joint Stiffness, Muscle Pain, Muscle Weakness and Swelling of Extremities. Neurological Not Present- Decreased Memory, Fainting, Headaches, Numbness, Seizures, Tingling, Tremor, Trouble walking  and Weakness. Psychiatric Not Present- Anxiety, Bipolar, Change in Sleep Pattern, Depression, Fearful and Frequent crying. Endocrine Not Present- Cold Intolerance, Excessive  Hunger, Hair Changes, Heat Intolerance, Hot flashes and New Diabetes. Hematology Not Present- Blood Thinners, Easy Bruising, Excessive bleeding, Gland problems, HIV and Persistent Infections.  Vitals  Weight: 205.25 lb Height: 66in Body Surface Area: 2.02 m Body Mass Index: 33.13 kg/m  Temp.: 98.52F  Pulse: 85 (Regular)  P.OX: 99% (Room air) BP: 142/90(Sitting, Left Arm, Standard)  Physical Exam   GENERAL APPEARANCE Development: normal Nutritional status: normal Gross deformities: none  SKIN Rash, lesions, ulcers: none Induration, erythema: none Nodules: none palpable  EYES Conjunctiva and lids: normal Pupils: equal and reactive Iris: normal bilaterally  EARS, NOSE, MOUTH, THROAT External ears: no lesion or deformity External nose: no lesion or deformity Hearing: grossly normal Due to Covid-19 pandemic, patient is wearing a mask.  NECK Symmetric: no Trachea: midline Thyroid: Right thyroid lobe is normal to palpation without discrete or dominant mass; there is a visible mass in the anterior left thyroid lobe. On palpation this is smooth, firm, somewhat lobular. It is nontender. It is mobile with swallowing. There is no associated lymphadenopathy in either the anterior or posterior cervical chains.  CHEST Respiratory effort: normal Retraction or accessory muscle use: no Breath sounds: normal bilaterally Rales, rhonchi, wheeze: none  CARDIOVASCULAR Auscultation: regular rhythm, normal rate Murmurs: none Pulses: radial pulse 2+ palpable Lower extremity edema: none  MUSCULOSKELETAL Station and gait: normal Digits and nails: no clubbing or cyanosis Muscle strength: grossly normal all extremities Range of motion: grossly normal all extremities Deformity: none  LYMPHATIC Cervical:  none palpable Supraclavicular: none palpable  PSYCHIATRIC Oriented to person, place, and time: yes Mood and affect: normal for situation Judgment and insight: appropriate for situation    Assessment & Plan  NEOPLASM OF UNCERTAIN BEHAVIOR OF THYROID GLAND (D44.0)  Patient is referred by her primary care provider for surgical evaluation and management of newly diagnosed thyroid neoplasm of uncertain behavior.  Patient provided with a copy of "The Thyroid Book: Medical and Surgical Treatment of Thyroid Problems", published by [employer], 16 pages. Book reviewed and explained to patient during visit today.  We reviewed her ultrasound report in detail and I provided her with a copy of her results. We reviewed the cytopathology report and the molecular genetic report. Patient has a genetic mutation which renders a very high chance of papillary thyroid carcinoma. According to the study, it is greater than 95%. Based on this result and the risk of recurrence, I have recommended proceeding with total thyroidectomy. We have discussed the risk and benefits of the surgery including the risk of injury to recurrent laryngeal nerves and parathyroid glands. We've discussed the size and location of the surgical incision. We discussed the hospital stay to be anticipated. We discussed her postoperative recovery and return to work and activities. We discussed the potential need for radioactive iodine treatment. We discussed the need for lifelong thyroid hormone replacement. She understands and wishes to proceed with surgery in the near future.  The risks and benefits of the procedure have been discussed at length with the patient. The patient understands the proposed procedure, potential alternative treatments, and the course of recovery to be expected. All of the patient's questions have been answered at this time. The patient wishes to proceed with surgery.  Armandina Gemma, MD [facility] Office: [phone]

## 2020-03-15 ENCOUNTER — Other Ambulatory Visit (HOSPITAL_COMMUNITY)
Admission: RE | Admit: 2020-03-15 | Discharge: 2020-03-15 | Disposition: A | Discharge status: Home / Self Care | Payer: 59 | Source: Ambulatory Visit | Attending: Surgery | Admitting: Surgery

## 2020-03-15 DIAGNOSIS — Z20822 Contact with and (suspected) exposure to covid-19: Secondary | ICD-10-CM | POA: Diagnosis not present

## 2020-03-15 DIAGNOSIS — Z01812 Encounter for preprocedural laboratory examination: Secondary | ICD-10-CM | POA: Insufficient documentation

## 2020-03-15 LAB — SARS CORONAVIRUS 2 (TAT 6-24 HRS): SARS Coronavirus 2: NEGATIVE

## 2020-03-16 ENCOUNTER — Encounter (HOSPITAL_COMMUNITY): Admission: RE | Admit: 2020-03-16 | Payer: 59 | Source: Ambulatory Visit

## 2020-03-16 ENCOUNTER — Other Ambulatory Visit: Payer: Self-pay

## 2020-03-16 ENCOUNTER — Encounter (HOSPITAL_COMMUNITY): Payer: Self-pay | Admitting: Surgery

## 2020-03-17 NOTE — Anesthesia Preprocedure Evaluation (Addendum)
Anesthesia Evaluation  Patient identified by MRN, date of birth, ID band Patient awake    Reviewed: Allergy & Precautions, NPO status , Patient's Chart, lab work & pertinent test results  Airway Mallampati: II  TM Distance: >3 FB Neck ROM: Full    Dental  (+) Dental Advisory Given   Pulmonary neg pulmonary ROS,    breath sounds clear to auscultation       Cardiovascular negative cardio ROS   Rhythm:Regular Rate:Normal     Neuro/Psych negative neurological ROS     GI/Hepatic Neg liver ROS, GERD  ,  Endo/Other  Thyroid mass  Renal/GU negative Renal ROS     Musculoskeletal   Abdominal   Peds  Hematology  (+) anemia ,   Anesthesia Other Findings   Reproductive/Obstetrics                            Lab Results  Component Value Date   WBC 6.8 03/18/2020   HGB 14.0 03/18/2020   HCT 41.2 03/18/2020   MCV 92.2 03/18/2020   PLT 206 03/18/2020   No results found for: CREATININE, BUN, NA, K, CL, CO2  Anesthesia Physical Anesthesia Plan  ASA: II  Anesthesia Plan: General   Post-op Pain Management:    Induction: Intravenous  PONV Risk Score and Plan: 3 and Dexamethasone, Ondansetron, Treatment may vary due to age or medical condition and Midazolam  Airway Management Planned: Oral ETT  Additional Equipment:   Intra-op Plan:   Post-operative Plan: Extubation in OR  Informed Consent: I have reviewed the patients History and Physical, chart, labs and discussed the procedure including the risks, benefits and alternatives for the proposed anesthesia with the patient or authorized representative who has indicated his/her understanding and acceptance.     Dental advisory given  Plan Discussed with: CRNA  Anesthesia Plan Comments:        Anesthesia Quick Evaluation

## 2020-03-18 ENCOUNTER — Encounter (HOSPITAL_COMMUNITY)
Admission: RE | Disposition: A | Discharge status: Home / Self Care | Payer: Self-pay | Source: Home / Self Care | Attending: Surgery

## 2020-03-18 ENCOUNTER — Ambulatory Visit (HOSPITAL_COMMUNITY): Payer: 59 | Admitting: Anesthesiology

## 2020-03-18 ENCOUNTER — Encounter (HOSPITAL_COMMUNITY): Payer: Self-pay | Admitting: Surgery

## 2020-03-18 ENCOUNTER — Ambulatory Visit (HOSPITAL_COMMUNITY)
Admission: RE | Admit: 2020-03-18 | Discharge: 2020-03-19 | Disposition: A | Discharge status: Home / Self Care | Payer: 59 | Attending: Surgery | Admitting: Surgery

## 2020-03-18 ENCOUNTER — Other Ambulatory Visit: Payer: Self-pay

## 2020-03-18 DIAGNOSIS — D440 Neoplasm of uncertain behavior of thyroid gland: Secondary | ICD-10-CM | POA: Diagnosis present

## 2020-03-18 DIAGNOSIS — Z1509 Genetic susceptibility to other malignant neoplasm: Secondary | ICD-10-CM | POA: Diagnosis not present

## 2020-03-18 DIAGNOSIS — Z8349 Family history of other endocrine, nutritional and metabolic diseases: Secondary | ICD-10-CM | POA: Diagnosis not present

## 2020-03-18 DIAGNOSIS — E063 Autoimmune thyroiditis: Secondary | ICD-10-CM | POA: Insufficient documentation

## 2020-03-18 DIAGNOSIS — C73 Malignant neoplasm of thyroid gland: Secondary | ICD-10-CM | POA: Insufficient documentation

## 2020-03-18 HISTORY — PX: THYROIDECTOMY: SHX17

## 2020-03-18 HISTORY — DX: Disorder of thyroid, unspecified: E07.9

## 2020-03-18 HISTORY — PX: LYMPH NODE DISSECTION: SHX5087

## 2020-03-18 HISTORY — DX: Anemia, unspecified: D64.9

## 2020-03-18 LAB — HCG, SERUM, QUALITATIVE: Preg, Serum: NEGATIVE

## 2020-03-18 LAB — CBC
HCT: 41.2 % (ref 36.0–46.0)
Hemoglobin: 14 g/dL (ref 12.0–15.0)
MCH: 31.3 pg (ref 26.0–34.0)
MCHC: 34 g/dL (ref 30.0–36.0)
MCV: 92.2 fL (ref 80.0–100.0)
Platelets: 206 10*3/uL (ref 150–400)
RBC: 4.47 MIL/uL (ref 3.87–5.11)
RDW: 12.1 % (ref 11.5–15.5)
WBC: 6.8 10*3/uL (ref 4.0–10.5)
nRBC: 0 % (ref 0.0–0.2)

## 2020-03-18 SURGERY — THYROIDECTOMY
Anesthesia: General | Site: Neck

## 2020-03-18 MED ORDER — ONDANSETRON HCL 4 MG/2ML IJ SOLN
INTRAMUSCULAR | Status: AC
  Filled 2020-03-18: qty 2

## 2020-03-18 MED ORDER — LACTATED RINGERS IV SOLN: INTRAVENOUS | Status: DC

## 2020-03-18 MED ORDER — MIDAZOLAM HCL 2 MG/2ML IJ SOLN
INTRAMUSCULAR | Status: AC
  Filled 2020-03-18: qty 2

## 2020-03-18 MED ORDER — CHLORHEXIDINE GLUCONATE CLOTH 2 % EX PADS: 6.0000 | MEDICATED_PAD | Freq: Once | CUTANEOUS | Status: DC

## 2020-03-18 MED ORDER — CALCIUM CARBONATE 1250 (500 CA) MG PO TABS
2.0000 | ORAL_TABLET | Freq: Three times a day (TID) | ORAL | Status: DC
  Administered 2020-03-18 – 2020-03-19 (×3): 1000 mg via ORAL
  Filled 2020-03-18 (×3): qty 1

## 2020-03-18 MED ORDER — ROCURONIUM BROMIDE 10 MG/ML (PF) SYRINGE
PREFILLED_SYRINGE | INTRAVENOUS | Status: DC | PRN
  Administered 2020-03-18: 50 mg via INTRAVENOUS
  Administered 2020-03-18: 20 mg via INTRAVENOUS

## 2020-03-18 MED ORDER — ONDANSETRON HCL 4 MG/2ML IJ SOLN: 4.0000 mg | Freq: Four times a day (QID) | INTRAMUSCULAR | Status: DC | PRN

## 2020-03-18 MED ORDER — CEFAZOLIN SODIUM-DEXTROSE 2-4 GM/100ML-% IV SOLN
2.0000 g | INTRAVENOUS | Status: AC
  Administered 2020-03-18: 2 g via INTRAVENOUS
  Filled 2020-03-18: qty 100

## 2020-03-18 MED ORDER — OXYCODONE HCL 5 MG PO TABS
5.0000 mg | ORAL_TABLET | ORAL | Status: DC | PRN
  Administered 2020-03-18 – 2020-03-19 (×2): 5 mg via ORAL
  Filled 2020-03-18 (×2): qty 1

## 2020-03-18 MED ORDER — FENTANYL CITRATE (PF) 100 MCG/2ML IJ SOLN
25.0000 ug | INTRAMUSCULAR | Status: DC | PRN
  Administered 2020-03-18: 50 ug via INTRAVENOUS
  Administered 2020-03-18: 25 ug via INTRAVENOUS

## 2020-03-18 MED ORDER — ACETAMINOPHEN 500 MG PO TABS
1000.0000 mg | ORAL_TABLET | Freq: Once | ORAL | Status: AC
  Administered 2020-03-18: 1000 mg via ORAL
  Filled 2020-03-18: qty 2

## 2020-03-18 MED ORDER — SUGAMMADEX SODIUM 200 MG/2ML IV SOLN
INTRAVENOUS | Status: DC | PRN
  Administered 2020-03-18: 200 mg via INTRAVENOUS

## 2020-03-18 MED ORDER — HYDROMORPHONE HCL 1 MG/ML IJ SOLN: 1.0000 mg | INTRAMUSCULAR | Status: DC | PRN

## 2020-03-18 MED ORDER — SODIUM CHLORIDE 0.45 % IV SOLN: INTRAVENOUS | Status: DC

## 2020-03-18 MED ORDER — KETOROLAC TROMETHAMINE 30 MG/ML IJ SOLN
30.0000 mg | Freq: Once | INTRAMUSCULAR | Status: AC
  Administered 2020-03-18: 30 mg via INTRAVENOUS

## 2020-03-18 MED ORDER — KETOROLAC TROMETHAMINE 30 MG/ML IJ SOLN
INTRAMUSCULAR | Status: AC
  Filled 2020-03-18: qty 1

## 2020-03-18 MED ORDER — DEXAMETHASONE SODIUM PHOSPHATE 10 MG/ML IJ SOLN
INTRAMUSCULAR | Status: DC | PRN
  Administered 2020-03-18: 10 mg via INTRAVENOUS

## 2020-03-18 MED ORDER — FENTANYL CITRATE (PF) 100 MCG/2ML IJ SOLN
INTRAMUSCULAR | Status: AC
  Administered 2020-03-18: 25 ug via INTRAVENOUS
  Filled 2020-03-18: qty 2

## 2020-03-18 MED ORDER — ONDANSETRON HCL 4 MG/2ML IJ SOLN
INTRAMUSCULAR | Status: DC | PRN
  Administered 2020-03-18: 4 mg via INTRAVENOUS

## 2020-03-18 MED ORDER — AMISULPRIDE (ANTIEMETIC) 5 MG/2ML IV SOLN: 10.0000 mg | Freq: Once | INTRAVENOUS | Status: DC | PRN

## 2020-03-18 MED ORDER — MIDAZOLAM HCL 5 MG/5ML IJ SOLN
INTRAMUSCULAR | Status: DC | PRN
  Administered 2020-03-18: 2 mg via INTRAVENOUS

## 2020-03-18 MED ORDER — 0.9 % SODIUM CHLORIDE (POUR BTL) OPTIME
TOPICAL | Status: DC | PRN
  Administered 2020-03-18: 1000 mL

## 2020-03-18 MED ORDER — PROPOFOL 10 MG/ML IV BOLUS
INTRAVENOUS | Status: AC
  Filled 2020-03-18: qty 40

## 2020-03-18 MED ORDER — ACETAMINOPHEN 325 MG PO TABS
650.0000 mg | ORAL_TABLET | Freq: Four times a day (QID) | ORAL | Status: DC | PRN
  Administered 2020-03-19: 650 mg via ORAL
  Filled 2020-03-18: qty 2

## 2020-03-18 MED ORDER — PROPOFOL 10 MG/ML IV BOLUS
INTRAVENOUS | Status: DC | PRN
  Administered 2020-03-18: 200 mg via INTRAVENOUS

## 2020-03-18 MED ORDER — CHLORHEXIDINE GLUCONATE 0.12 % MT SOLN
15.0000 mL | Freq: Once | OROMUCOSAL | Status: AC
  Administered 2020-03-18: 15 mL via OROMUCOSAL

## 2020-03-18 MED ORDER — FENTANYL CITRATE (PF) 250 MCG/5ML IJ SOLN
INTRAMUSCULAR | Status: AC
  Filled 2020-03-18: qty 5

## 2020-03-18 MED ORDER — ONDANSETRON 4 MG PO TBDP: 4.0000 mg | ORAL_TABLET | Freq: Four times a day (QID) | ORAL | Status: DC | PRN

## 2020-03-18 MED ORDER — DEXAMETHASONE SODIUM PHOSPHATE 10 MG/ML IJ SOLN
INTRAMUSCULAR | Status: AC
  Filled 2020-03-18: qty 1

## 2020-03-18 MED ORDER — TRAMADOL HCL 50 MG PO TABS
50.0000 mg | ORAL_TABLET | Freq: Four times a day (QID) | ORAL | Status: DC | PRN
  Administered 2020-03-18 – 2020-03-19 (×3): 50 mg via ORAL
  Filled 2020-03-18 (×4): qty 1

## 2020-03-18 MED ORDER — ORAL CARE MOUTH RINSE: 15.0000 mL | Freq: Once | OROMUCOSAL | Status: AC

## 2020-03-18 MED ORDER — FENTANYL CITRATE (PF) 250 MCG/5ML IJ SOLN
INTRAMUSCULAR | Status: DC | PRN
  Administered 2020-03-18 (×3): 50 ug via INTRAVENOUS
  Administered 2020-03-18: 100 ug via INTRAVENOUS

## 2020-03-18 MED ORDER — ROCURONIUM BROMIDE 10 MG/ML (PF) SYRINGE
PREFILLED_SYRINGE | INTRAVENOUS | Status: AC
  Filled 2020-03-18: qty 10

## 2020-03-18 MED ORDER — ACETAMINOPHEN 650 MG RE SUPP: 650.0000 mg | Freq: Four times a day (QID) | RECTAL | Status: DC | PRN

## 2020-03-18 SURGICAL SUPPLY — 33 items
ATTRACTOMAT 16X20 MAGNETIC DRP (DRAPES) ×3 IMPLANT
BLADE SURG 15 STRL LF DISP TIS (BLADE) ×1 IMPLANT
BLADE SURG 15 STRL SS (BLADE) ×2
BLADE SURG SZ10 CARB STEEL (BLADE) IMPLANT
CHLORAPREP W/TINT 26 (MISCELLANEOUS) ×3 IMPLANT
CLIP VESOCCLUDE MED 6/CT (CLIP) ×12 IMPLANT
CLIP VESOCCLUDE SM WIDE 6/CT (CLIP) ×15 IMPLANT
COVER SURGICAL LIGHT HANDLE (MISCELLANEOUS) ×3 IMPLANT
COVER WAND RF STERILE (DRAPES) ×3 IMPLANT
DERMABOND ADVANCED (GAUZE/BANDAGES/DRESSINGS) ×2
DERMABOND ADVANCED .7 DNX12 (GAUZE/BANDAGES/DRESSINGS) ×1 IMPLANT
DRAPE LAPAROTOMY T 98X78 PEDS (DRAPES) ×3 IMPLANT
DRAPE UTILITY XL STRL (DRAPES) ×3 IMPLANT
ELECT PENCIL ROCKER SW 15FT (MISCELLANEOUS) ×3 IMPLANT
GAUZE 4X4 16PLY RFD (DISPOSABLE) ×3 IMPLANT
GLOVE SURG ORTHO 8.0 STRL STRW (GLOVE) ×3 IMPLANT
GOWN STRL REUS W/ TWL XL LVL3 (GOWN DISPOSABLE) ×3 IMPLANT
GOWN STRL REUS W/TWL XL LVL3 (GOWN DISPOSABLE) ×12 IMPLANT
HEMOSTAT SURGICEL 2X4 FIBR (HEMOSTASIS) ×3 IMPLANT
ILLUMINATOR WAVEGUIDE N/F (MISCELLANEOUS) ×3 IMPLANT
KIT BASIN OR (CUSTOM PROCEDURE TRAY) ×3 IMPLANT
KIT TURNOVER KIT A (KITS) IMPLANT
NS IRRIG 1000ML POUR BTL (IV SOLUTION) ×3 IMPLANT
PACK BASIC VI WITH GOWN DISP (CUSTOM PROCEDURE TRAY) ×3 IMPLANT
SHEARS HARMONIC 9CM CVD (BLADE) ×3 IMPLANT
SUT MNCRL AB 4-0 PS2 18 (SUTURE) ×3 IMPLANT
SUT VIC AB 3-0 SH 18 (SUTURE) ×6 IMPLANT
SYR BULB IRRIG 60ML STRL (SYRINGE) ×3 IMPLANT
SYR CONTROL 10ML LL (SYRINGE) ×3 IMPLANT
TOWEL OR 17X26 10 PK STRL BLUE (TOWEL DISPOSABLE) ×6 IMPLANT
TOWEL OR NON WOVEN STRL DISP B (DISPOSABLE) ×3 IMPLANT
TUBING CONNECTING 10 (TUBING) ×2 IMPLANT
TUBING CONNECTING 10' (TUBING) ×1

## 2020-03-18 NOTE — Op Note (Signed)
Procedure Note  Pre-operative Diagnosis:  Thyroid neoplasm of uncertain behavior  Post-operative Diagnosis:  same  Surgeon:  Armandina Gemma, MD  Assistant:  none   Procedure:  Total thyroidectomy with limited central compartment lymph node dissection  Anesthesia:  General  Estimated Blood Loss:  minimal  Drains: none         Specimen: thyroid to pathology  Indications:  Patient is referred by Clydie Braun, NP, for surgical evaluation and management of a newly diagnosed thyroid neoplasm of uncertain behavior. Patient found a mass in the anterior neck in May 2021. She demonstrated this to her primary care provider. She underwent ultrasound examination on February 05, 2020. This showed an enlarged left thyroid lobe with a dominant nodule measuring 3.6 cm in size. This was felt to be mildly suspicious and fine-needle aspiration biopsy was recommended. Right thyroid lobe appeared normal. Lymph nodes appeared normal. Fine-needle aspiration biopsy cytopathology showed atypia of undetermined significance, Bethesda category III, on February 17, 2020. 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 Recommendation is for total thyroidectomy due to a significant risk of recurrence. Patient presents today for thyroid surgery.   Procedure Details: Procedure was done in OR #4 at the [facility]. The patient was brought to the operating room and placed in a supine position on the operating room table. Following administration of general anesthesia, the patient was positioned and then prepped and draped in the usual aseptic fashion. After ascertaining that an adequate level of anesthesia had been achieved, a small Kocher incision was made with #15 blade. Dissection was carried through subcutaneous tissues and platysma.Hemostasis was  achieved with the electrocautery. Skin flaps were elevated cephalad and caudad from the thyroid notch to the sternal notch. A Mahorner self-retaining retractor was placed for exposure. Strap muscles were incised in the midline and dissection was begun on the left side.  Strap muscles were reflected laterally.  Left thyroid lobe was enlarged with a dominant nodule.  The left lobe was gently mobilized with blunt dissection. Superior pole vessels were dissected out and divided individually between small and medium ligaclips with the harmonic scalpel. The thyroid lobe was rolled anteriorly. Branches of the inferior thyroid artery were divided between small ligaclips with the harmonic scalpel. Inferior venous tributaries were divided between ligaclips. Both the superior and inferior parathyroid glands were identified and preserved on their vascular pedicles. The recurrent laryngeal nerve was identified and preserved along its course although it was intimately apposed to the ligament of Berry. The ligament of Gwenlyn Found was released with the harmonic scalpel and the gland was mobilized onto the anterior trachea. Isthmus was mobilized across the midline. There was no significant pyramidal lobe present. Dry pack was placed in the left neck.  The right thyroid lobe was gently mobilized with blunt dissection. Right thyroid lobe was normal in size. Superior pole vessels were dissected out and divided between small and medium ligaclips with the Harmonic scalpel. Superior parathyroid was identified and preserved. Inferior venous tributaries were divided between medium ligaclips with the harmonic scalpel. The right thyroid lobe was rolled anteriorly and the branches of the inferior thyroid artery divided between small ligaclips. The right recurrent laryngeal nerve was identified and preserved along its course. The ligament of Gwenlyn Found was released with the electrocautery. The right thyroid lobe was mobilized onto the anterior trachea  and the remainder of the thyroid  was dissected off the anterior trachea and the thyroid was completely excised. A suture was used to mark the left lobe. The entire thyroid gland was submitted to pathology for review.  Palpation revealed no grossly enlarged or abnormal lymph nodes.  The central compartment was dissected out anterior to the trachea.  Tissue was excised with at least two visible lymph nodes using ligaclips and the harmonic scalpel for hemostasis.  Tissue was submitted separately to pathology for review.  The neck was irrigated with warm saline. Fibrillar was placed throughout the operative field. Strap muscles were approximated in the midline with interrupted 3-0 Vicryl sutures. Platysma was closed with interrupted 3-0 Vicryl sutures. Skin was closed with a running 4-0 Monocryl subcuticular suture. Wound was washed and Dermabond was applied. The patient was awakened from anesthesia and brought to the recovery room. The patient tolerated the procedure well.   Armandina Gemma, MD [facility] Office: [phone]

## 2020-03-18 NOTE — Interval H&P Note (Signed)
History and Physical Interval Note:  03/18/2020 7:00 AM  Andrea [name]  has presented today for surgery, with the diagnosis of THYROID NEOPLASM OF UNCERTAIN BEHAVIOR.  The various methods of treatment have been discussed with the patient and family. After consideration of risks, benefits and other options for treatment, the patient has consented to    Procedure(s): TOTAL THYROIDECTOMY (N/A) LIMITED LYMPH NODE DISSECTION (N/A) as a surgical intervention.    The patient's history has been reviewed, patient examined, no change in status, stable for surgery.  I have reviewed the patient's chart and labs.  Questions were answered to the patient's satisfaction.    Armandina Gemma, MD [facility] Office: [place]

## 2020-03-18 NOTE — Anesthesia Postprocedure Evaluation (Signed)
Anesthesia Post Note  Patient: Katryna S Rice  Procedure(s) Performed: TOTAL THYROIDECTOMY (N/A Neck) LIMITED LYMPH NODE DISSECTION (N/A Neck)     Patient location during evaluation: PACU Anesthesia Type: General Level of consciousness: awake and alert Pain management: pain level controlled Vital Signs Assessment: post-procedure vital signs reviewed and stable Respiratory status: spontaneous breathing, nonlabored ventilation, respiratory function stable and patient connected to nasal cannula oxygen Cardiovascular status: blood pressure returned to baseline and stable Postop Assessment: no apparent nausea or vomiting Anesthetic complications: no   No complications documented.  Last Vitals:  Vitals:   03/18/20 1015 03/18/20 1030  BP: 135/79 132/89  Pulse: 64 68  Resp:    Temp:    SpO2: 100% 97%    Last Pain:  Vitals:   03/18/20 1015  TempSrc:   PainSc: 4                  Tiajuana Amass

## 2020-03-18 NOTE — Anesthesia Procedure Notes (Signed)
Procedure Name: Intubation Date/Time: 03/18/2020 7:37 AM Performed by: Talbot Grumbling, CRNA Pre-anesthesia Checklist: Patient identified, Emergency Drugs available, Suction available and Patient being monitored Patient Re-evaluated:Patient Re-evaluated prior to induction Oxygen Delivery Method: Circle system utilized Preoxygenation: Pre-oxygenation with 100% oxygen Induction Type: IV induction Ventilation: Mask ventilation without difficulty Laryngoscope Size: Mac and 3 Grade View: Grade I Tube type: Oral Tube size: 7.5 mm Number of attempts: 1 Airway Equipment and Method: Stylet Placement Confirmation: ETT inserted through vocal cords under direct vision,  positive ETCO2 and breath sounds checked- equal and bilateral Secured at: 22 cm Tube secured with: Tape Dental Injury: Teeth and Oropharynx as per pre-operative assessment

## 2020-03-18 NOTE — Transfer of Care (Signed)
Immediate Anesthesia Transfer of Care Note  Patient: Andrea [name]  Procedure(s) Performed: TOTAL THYROIDECTOMY (N/A Neck) LIMITED LYMPH NODE DISSECTION (N/A Neck)  Patient Location: PACU  Anesthesia Type:General  Level of Consciousness: sedated  Airway & Oxygen Therapy: Patient Spontanous Breathing and Patient connected to face mask oxygen  Post-op Assessment: Report given to RN and Post -op Vital signs reviewed and stable  Post vital signs: Reviewed and stable  Last Vitals:  Vitals Value Taken Time  BP 128/87 03/18/20 0924  Temp    Pulse 80 03/18/20 0924  Resp 8 03/18/20 0924  SpO2 100 % 03/18/20 0924  Vitals shown include unvalidated device data.  Last Pain:  Vitals:   03/18/20 0645  TempSrc:   PainSc: 0-No pain      Patients Stated Pain Goal: 3 (03/07/61 4469)  Complications: No complications documented.

## 2020-03-19 ENCOUNTER — Encounter (HOSPITAL_COMMUNITY): Payer: Self-pay | Admitting: Surgery

## 2020-03-19 DIAGNOSIS — C73 Malignant neoplasm of thyroid gland: Secondary | ICD-10-CM | POA: Diagnosis not present

## 2020-03-19 LAB — BASIC METABOLIC PANEL
Anion gap: 8 (ref 5–15)
BUN: 16 mg/dL (ref 6–20)
CO2: 26 mmol/L (ref 22–32)
Calcium: 7.9 mg/dL — ABNORMAL LOW (ref 8.9–10.3)
Chloride: 106 mmol/L (ref 98–111)
Creatinine, Ser: 0.82 mg/dL (ref 0.44–1.00)
GFR, Estimated: >60 mL/min (ref >60)
Glucose, Bld: 126 mg/dL — ABNORMAL HIGH (ref 70–99)
Potassium: 3.5 mmol/L (ref 3.5–5.1)
Sodium: 140 mmol/L (ref 135–145)

## 2020-03-19 MED ORDER — CALCIUM GLUCONATE-NACL 2-0.675 GM/100ML-% IV SOLN
2.0000 g | Freq: Once | INTRAVENOUS | Status: AC
  Administered 2020-03-19: 2000 mg via INTRAVENOUS
  Filled 2020-03-19: qty 100

## 2020-03-19 MED ORDER — TRAMADOL HCL 50 MG PO TABS
50.0000 mg | ORAL_TABLET | Freq: Four times a day (QID) | ORAL | 0 refills | Status: DC | PRN
Start: 2020-03-19 — End: 2020-05-21

## 2020-03-19 MED ORDER — LEVOTHYROXINE SODIUM 100 MCG PO TABS: 100.0000 ug | ORAL_TABLET | Freq: Every day | ORAL | 2 refills | Status: DC

## 2020-03-19 MED ORDER — CALCIUM CARBONATE ANTACID 500 MG PO CHEW: 2.0000 | CHEWABLE_TABLET | Freq: Four times a day (QID) | ORAL | 1 refills | Status: DC

## 2020-03-19 NOTE — Discharge Instructions (Signed)
[facility]  THYROID & PARATHYROID SURGERY:  POST-OP INSTRUCTIONS  Always review your discharge instruction sheet from the facility where your surgery was performed.  A prescription for pain medication may be given to you upon discharge.  Take your pain medication as prescribed.  If narcotic pain medicine is not needed, then you may take acetaminophen (Tylenol) or ibuprofen (Advil) as needed.  Take your usually prescribed medications unless otherwise directed.  If you need a refill on your pain medication, please contact our office during regular business hours.  Prescriptions cannot be processed by our office after 5 pm or on weekends.  Start with a light diet upon arrival home, such as soup and crackers or toast.  Be sure to drink plenty of fluids daily.  Resume your normal diet the day after surgery.  Most patients will experience some swelling and bruising on the chest and neck area.  Ice packs will help.  Swelling and bruising can take several days to resolve.   It is common to experience some constipation after surgery.  Increasing fluid intake and taking a stool softener (Colace) will usually help or prevent this problem.  A mild laxative (Milk of Magnesia or Miralax) should be taken according to package directions if there has been no bowel movement after 48 hours.  You have steri-strips and a gauze dressing over your incision.  You may remove the gauze bandage on the second day after surgery, and you may shower at that time.  Leave your steri-strips (small skin tapes) in place directly over the incision.  These strips should remain on the skin for 5-7 days and then be removed.  You may get them wet in the shower and pat them dry.  You may resume regular (light) daily activities beginning the next day (such as daily self-care, walking, climbing stairs) gradually increasing activities as tolerated.  You may have sexual intercourse when it is comfortable.  Refrain from  any heavy lifting or straining until approved by your doctor.  You may drive when you no longer are taking prescription pain medication, you can comfortably wear a seatbelt, and you can safely maneuver your car and apply brakes.  You should see your doctor in the office for a follow-up appointment approximately three weeks after your surgery.  Make sure that you call for this appointment within a day or two after you arrive home to insure a convenient appointment time.  WHEN TO CALL YOUR DOCTOR: -- Fever greater than 101.5 -- Inability to urinate -- Nausea and/or vomiting - persistent -- Extreme swelling or bruising -- Continued bleeding from incision -- Increased pain, redness, or drainage from the incision -- Difficulty swallowing or breathing -- Muscle cramping or spasms -- Numbness or tingling in hands or around lips  The clinic staff is available to answer your questions during regular business hours.  Please don't hesitate to call and ask to speak to one of the nurses if you have concerns.  Dalene Robards, MD [facility] Office: [phone] 

## 2020-03-19 NOTE — Discharge Summary (Signed)
[facility] Discharge Summary   Patient ID: Andrea [name] MRN: [id number] DOB/AGE: June 20, 1995 24 y.o.  Admit date: 03/18/2020 Discharge date: 03/19/2020  Admitting Diagnosis: Thyroid nodule   Discharge Diagnosis Patient Active Problem List   Diagnosis Date Noted  . Neoplasm of uncertain behavior of thyroid gland 03/14/2020  . Maternal anemia, with delivery 05/29/2019  . Multiple gestation 05/28/2019  . S/P cesarean section - MoDi twins, 36 wks, malpresentation 05/28/2019  . Postpartum care following cesarean delivery (2/10) 05/28/2019    Consultants None   Imaging: No results found.  Procedures Dr. Harlow Asa (03/19/20) - Total thyroidectomy with limited central compartment lymph node dissection   Hospital Course:  Patient is a 24 year old female who presented to [facility] with thyroid nodule.   Patient was admitted and underwent procedure listed above.  Tolerated procedure well and was transferred to the floor.  Diet was advanced as tolerated.  On POD#1, the patient was voiding well, tolerating diet, ambulating well, pain well controlled, vital signs stable, incision c/d/i and felt stable for discharge home.  Patient will follow up in our office in 2 weeks and knows to call with questions or concerns.  She will call to confirm appointment date/time.    Physical Exam: General:  Alert, NAD, pleasant, comfortable Chest: RRR, normal effort of breathing  Neck: incision c/d/i without swelling or induration   I or a member of my team have reviewed this patient in the Controlled Substance Database.   Allergies as of 03/19/2020      Reactions   Codeine Shortness Of Breath, Rash      Medication List    TAKE these medications   acetaminophen 500 MG tablet Commonly known as: TYLENOL Take 2 tablets (1,000 mg total) by mouth every 6 (six) hours as needed for mild pain.   calcium carbonate 500 MG chewable tablet Commonly known as: Tums Chew 2 tablets (400 mg of elemental  calcium total) by mouth 4 (four) times daily.   esomeprazole 20 MG capsule Commonly known as: NEXIUM Take 20 mg by mouth 2 (two) times daily before a meal.   ibuprofen 800 MG tablet Commonly known as: ADVIL Take 1 tablet (800 mg total) by mouth every 6 (six) hours.   iron polysaccharides 150 MG capsule Commonly known as: NIFEREX Take 1 capsule (150 mg total) by mouth daily.   levothyroxine 100 MCG tablet Commonly known as: Synthroid Take 1 tablet (100 mcg total) by mouth daily.   magnesium oxide 400 (241.3 Mg) MG tablet Commonly known as: MAG-OX Take 1 tablet (400 mg total) by mouth 2 (two) times daily.   ondansetron 4 MG disintegrating tablet Commonly known as: ZOFRAN-ODT Take 4 mg by mouth every 8 (eight) hours as needed for nausea or vomiting.   senna-docusate 8.6-50 MG tablet Commonly known as: Senokot-S Take 2 tablets by mouth at bedtime as needed for mild constipation.   Skyla 13.5 MG Iud Generic drug: Levonorgestrel 13.5 mg by Intrauterine route once.   traMADol 50 MG tablet Commonly known as: ULTRAM Take 1-2 tablets (50-100 mg total) by mouth every 6 (six) hours as needed for moderate pain.         Follow-up Information    Armandina Gemma, MD. Schedule an appointment as soon as possible for a visit in 3 week(s).   Specialty: General Surgery Contact information: [address] [place] [address] [phone]               Signed: Claiborne Billings  Baldo Daub , [facility] 03/19/2020, 2:40 PM Please see Amion for pager number during day hours 7:00am-4:30pm

## 2020-03-19 NOTE — Progress Notes (Signed)
D/C instructions given to patient. Patient had no questions. NT or writer will wheel patient out once she is dressed

## 2020-03-22 LAB — SURGICAL PATHOLOGY

## 2020-05-07 ENCOUNTER — Ambulatory Visit: Payer: 59 | Admitting: Internal Medicine

## 2020-05-21 ENCOUNTER — Other Ambulatory Visit: Payer: Self-pay

## 2020-05-21 ENCOUNTER — Encounter: Payer: Self-pay | Admitting: Internal Medicine

## 2020-05-21 ENCOUNTER — Ambulatory Visit (INDEPENDENT_AMBULATORY_CARE_PROVIDER_SITE_OTHER): Payer: 59 | Admitting: Internal Medicine

## 2020-05-21 VITALS — BP 120/82 | HR 79 | Ht 66.0 in | Wt 213.4 lb

## 2020-05-21 DIAGNOSIS — C73 Malignant neoplasm of thyroid gland: Secondary | ICD-10-CM | POA: Diagnosis not present

## 2020-05-21 DIAGNOSIS — E890 Postprocedural hypothyroidism: Secondary | ICD-10-CM

## 2020-05-21 LAB — TSH: TSH: 5.03 u[IU]/mL — ABNORMAL HIGH (ref 0.35–4.50)

## 2020-05-21 LAB — T4, FREE: Free T4: 1.02 ng/dL (ref 0.60–1.60)

## 2020-05-21 MED ORDER — LEVOTHYROXINE SODIUM 112 MCG PO TABS: 112.0000 ug | ORAL_TABLET | Freq: Every day | ORAL | 3 refills | Status: DC

## 2020-05-21 NOTE — Progress Notes (Addendum)
Patient ID: Andrea [name], female   DOB: Jul 08, 1995, 25 y.o.   MRN: [id number]   This visit occurred during the SARS-CoV-2 public health emergency.  Safety protocols were in place, including screening questions prior to the visit, additional usage of staff PPE, and extensive cleaning of exam room while observing appropriate contact time as indicated for disinfecting solutions.   HPI  Andrea [name] is a 25 y.o.-year-old female, referred by  Dr. 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  Pt. has been dx with thyroid cancer in 01/2020 after having noticed a lump in neck in 08/2019, after she gave birth to twins in 05/2019. She had a thyroid ultrasound that showed a nodule which was biopsied with inconclusive results, however, Afirma molecular marker returned suspicious.  These tests are not available for review.  03/18/2020: Total thyroidectomy with limited neck dissection (Dr. Harlow Asa): A. THYROID, TOTAL THYROIDECTOMY:  - Papillary thyroid carcinoma, 3.2 cm, left lobe.  - Margins not involved.  - No perithyroidal extension.  - Chronic lymphocytic thyroiditis (Hashimoto's).  - See oncology table.   B. LYMPH NODES, CENTRAL COMPARTMENT, RESECTION:  - Five benign lymph nodes (0/5).   ONCOLOGY TABLE:  THYROID GLAND:  Procedure: Thyroidectomy and central compartment lymph nodes.  Tumor Focality: Unifocal.  Tumor Site: Left lobe.  Tumor Size: 3.2 x 3 x 2 cm.  Histologic Type: Papillary thyroid carcinoma, predominantly follicular variant.  Margins: Not involved by tumor.  Angioinvasion: Not identified.  Lymphatic Invasion: Not identified.  Extrathyroidal extension: Not identified.  Regional Lymph Nodes:    Number of Lymph Nodes Involved: 0    Number of Lymph Nodes Examined: 5    Nodal Levels Examined: Central compartment.  Pathologic Stage Classification (pTNM, AJCC 8th Edition): pT2, pN0  Representative  Tumor Block: A1-A14  Comment(s): The tumor is well-circumscribed and has nuclear histologic  features consistent with papillary thyroid carcinoma. The tumor is  predominantly follicular variant with a few foci of papillary  differentiation. The nonneoplastic thyroid shows marked lymphocytic  thyroiditis.  (v4.2.0.0)   Pt denies: - feeling nodules in neck - hoarseness - dysphagia - choking - SOB with lying down  For her postsurgical hypothyroidism, she is on Levothyroxine 100 mcg, taken: - fasting - with water - separated by >30 min from b'fast  - no calcium, iron, PPIs, multivitamins   I reviewed pt's thyroid tests: 04/08/2020: TSH 3.63 No results found for: TSH, FREET4   Pt describes: - + fatigue - +  weight gain: 180 lbs in 07/2019 >> 213 lbs now  She denies: - cold intolerance - constipation - dry skin - hair loss - depression  She has + FH of thyroid disorders in: MGM. No FH of thyroid cancer.  No h/o radiation tx to head or neck. No steroids, no ABx.  ROS: Constitutional: + See HPI Eyes: no blurry vision, no xerophthalmia ENT: no sore throat, no nodules felt in neck, no dysphagia/odynophagia, no hoarseness Cardiovascular: no CP/+ SOB/no palpitations/leg swelling Respiratory: no cough/+ SOB Gastrointestinal: no N/V/D/C Musculoskeletal: no muscle/joint aches Skin: no rashes, + excessive hair growth on chin Neurological: no tremors/numbness/tingling/dizziness Psychiatric: no depression/anxiety  Past Medical History:  Diagnosis Date  . Anemia    with pregnancy  . Thyroid disease    Past Surgical History:  Procedure Laterality Date  . APPENDECTOMY    . CESAREAN SECTION MULTI-GESTATIONAL N/A 05/28/2019   Procedure: CESAREAN SECTION MULTI-GESTATIONAL;  Surgeon: [name], MD;  Location[facility] ORS;  Service: Obstetrics;  Laterality: N/A;  . LYMPH NODE DISSECTION N/A 03/18/2020   Procedure: LIMITED LYMPH NODE DISSECTION;  Surgeon: Armandina Gemma, MD;   Location: [facility] ORS;  Service: General;  Laterality: N/A;  . THYROIDECTOMY N/A 03/18/2020   Procedure: TOTAL THYROIDECTOMY;  Surgeon: Armandina Gemma, MD;  Location: [facility] ORS;  Service: General;  Laterality: N/A;  . TONSILLECTOMY     Social History   Socioeconomic History  . Marital status: Married    Spouse name: Not on file  . Number of children: 2  . Years of education: Not on file  . Highest education level: Not on file  Occupational History  . Occupation: cosmetologist  Tobacco Use  . Smoking status: Never Smoker  . Smokeless tobacco: Never Used  Vaping Use  . Vaping Use: Never used  Substance and Sexual Activity  . Alcohol use: Never  . Drug use: Never  . Sexual activity: Not on file  Other Topics Concern  . Not on file  Social History Narrative   Had twins 05/2019.   Social Determinants of Health   Financial Resource Strain: Not on file  Food Insecurity: Not on file  Transportation Needs: Not on file  Physical Activity: Not on file  Stress: Not on file  Social Connections: Not on file  Intimate Partner Violence: Not on file   Current Outpatient Medications on File Prior to Visit  Medication Sig Dispense Refill  . levothyroxine (SYNTHROID) 100 MCG tablet Take 1 tablet (100 mcg total) by mouth daily. 30 tablet 2  . acetaminophen (TYLENOL) 500 MG tablet Take 2 tablets (1,000 mg total) by mouth every 6 (six) hours as needed for mild pain. (Patient not taking: No sig reported) 60 tablet 1  . calcium carbonate (TUMS) 500 MG chewable tablet Chew 2 tablets (400 mg of elemental calcium total) by mouth 4 (four) times daily. (Patient not taking: Reported on 05/21/2020) 120 tablet 1  . esomeprazole (NEXIUM) 20 MG capsule Take 20 mg by mouth 2 (two) times daily before a meal. (Patient not taking: No sig reported)    . ibuprofen (ADVIL) 800 MG tablet Take 1 tablet (800 mg total) by mouth every 6 (six) hours. (Patient not taking: No sig reported) 60 tablet 1  . iron polysaccharides  (NIFEREX) 150 MG capsule Take 1 capsule (150 mg total) by mouth daily. (Patient not taking: No sig reported) 30 capsule 1  . Levonorgestrel (SKYLA) 13.5 MG IUD 13.5 mg by Intrauterine route once. (Patient not taking: Reported on 05/21/2020)    . magnesium oxide (MAG-OX) 400 (241.3 Mg) MG tablet Take 1 tablet (400 mg total) by mouth 2 (two) times daily. (Patient not taking: No sig reported) 60 tablet 1  . ondansetron (ZOFRAN-ODT) 4 MG disintegrating tablet Take 4 mg by mouth every 8 (eight) hours as needed for nausea or vomiting. (Patient not taking: No sig reported)    . senna-docusate (SENOKOT-S) 8.6-50 MG tablet Take 2 tablets by mouth at bedtime as needed for mild constipation. (Patient not taking: No sig reported) 60 tablet 1  . traMADol (ULTRAM) 50 MG tablet Take 1-2 tablets (50-100 mg total) by mouth every 6 (six) hours as needed for moderate pain. (Patient not taking: Reported on 05/21/2020) 15 tablet 0   No current facility-administered medications on file prior to visit.   Allergies  Allergen Reactions  . Codeine Shortness Of Breath and Rash   Family History  Problem Relation Age of Onset  . Lung cancer Maternal Grandmother   .  Thyroid disease Maternal Grandmother   . Heart attack Maternal Grandfather   . Hypertension Maternal Grandfather    PE: BP 120/82   Pulse 79   Ht 5' 6"  (1.676 m)   Wt 213 lb 6.4 oz (96.8 kg)   SpO2 99%   BMI 34.44 kg/m  Wt Readings from Last 3 Encounters:  05/21/20 213 lb 6.4 oz (96.8 kg)  03/18/20 205 lb (93 kg)  05/28/19 224 lb 9.6 oz (101.9 kg)   Constitutional: overweight, in NAD Eyes: PERRLA, EOMI, no exophthalmos ENT: moist mucous membranes, no neck masses palpated, thyroidectomy scar healing, with slight erythema ans minimal edema, without keloid or pain on palpation, no cervical lymphadenopathy Cardiovascular: RRR, No MRG Respiratory: CTA B Gastrointestinal: abdomen soft, NT, ND, BS+ Musculoskeletal: no deformities, strength intact in all  4 Skin: moist, warm, no rashes Neurological: no tremor with outstretched hands, DTR normal in all 4  ASSESSMENT: 1. Thyroid cancer -follicular variant of papillary thyroid cancer - see HPI  2. Postsurgical Hypothyroidism  PLAN:  1. FV of PTC - I had a long discussion with the patient about her recent diagnosis of thyroid cancer. We reviewed together the pathology >> due to age, she is stage 1 TNM.  Also, based on the review of the pathology, the tumor was well-circumscribed, without high-grade tumor features, without lymphovascular invasion and without extrathyroidal extension.  5 lymph nodes in the central compartment were analyzed and they were negative for metastasis. - I reassured her that papillary thyroid cancer is a slow growing cancer with good prognosis; her life expectancy or quality of life is unlikely to be reduced due to the cancer.  - since the tumor was low-grade and <4 cm in size, radioactive iodine is not indicated for post-op thyroid remnant ablation.  - pt appears relieved, especially since she has 53-year-old twins at home we discussed that if the need arises for RAI treatment in the future, studies show that her prognosis will not be changed. - I will then see the patient in approximately 3-4 months and then repeat a neck ultrasound in a year after the surgery.  2.  Patient with h/o total thyroidectomy for cancer, now with iatrogenic hypothyroidism, on levothyroxine 100 mcg daily.  - latest thyroid labs reviewed with pt >> normal, but close to the upper end of the normal interval, on 04/08/2020: TSH 3.63.  We discussed that in the setting of thyroid cancer, we would target a TSH at the lower end of the normal interval. - pt also feels more tired and she gained  weight lately-per our scale, 8 pound since the surgery, but since 07/2019, she reports a weight gain of 33 lbs - we discussed about taking the thyroid hormone every day, with water, >30 minutes before breakfast,  separated by >4 hours from acid reflux medications, calcium, iron, multivitamins. Pt. is taking it correctly. - will recheck thyroid tests today: TSH and fT4 - If labs are abnormal, she will need to return for repeat TFTs in 1.5 months - OTW, we will check at next visit  Component     Latest Ref Rng & Units 05/21/2020  TSH     0.35 - 4.50 uIU/mL 5.03 (H)  T4,Free(Direct)     0.60 - 1.60 ng/dL 1.02  TSH elevated >> will need to increase the levothyroxine dose to 112 mcg daily and recheck her tests in 1.5 months.  Component     Latest Ref Rng & Units 07/02/2020  TSH  0.35 - 4.50 uIU/mL 0.73  T4,Free(Direct)     0.60 - 1.60 ng/dL 1.28  TFTs normalized.  Philemon Kingdom, MD PhD [facility]

## 2020-05-21 NOTE — Patient Instructions (Addendum)
Please stop at the lab.   Lease continue Levothyroxine 100 mcg daily.  Take the thyroid hormone every day, with water, at least 30 minutes before breakfast, separated by at least 4 hours from: - acid reflux medications - calcium - iron - multivitamins  Please check out [email or web address] website.  Please return in 3-4 months.

## 2020-07-02 ENCOUNTER — Other Ambulatory Visit (INDEPENDENT_AMBULATORY_CARE_PROVIDER_SITE_OTHER): Payer: 59

## 2020-07-02 ENCOUNTER — Other Ambulatory Visit: Payer: Self-pay

## 2020-07-02 DIAGNOSIS — E890 Postprocedural hypothyroidism: Secondary | ICD-10-CM

## 2020-07-02 LAB — TSH: TSH: 0.73 u[IU]/mL (ref 0.35–4.50)

## 2020-07-02 LAB — T4, FREE: Free T4: 1.28 ng/dL (ref 0.60–1.60)

## 2020-08-20 ENCOUNTER — Other Ambulatory Visit: Payer: Self-pay

## 2020-08-20 ENCOUNTER — Ambulatory Visit (INDEPENDENT_AMBULATORY_CARE_PROVIDER_SITE_OTHER): Payer: 59 | Admitting: Internal Medicine

## 2020-08-20 ENCOUNTER — Encounter: Payer: Self-pay | Admitting: Internal Medicine

## 2020-08-20 VITALS — BP 130/82 | HR 67 | Ht 66.0 in | Wt 199.2 lb

## 2020-08-20 DIAGNOSIS — E890 Postprocedural hypothyroidism: Secondary | ICD-10-CM | POA: Diagnosis not present

## 2020-08-20 DIAGNOSIS — C73 Malignant neoplasm of thyroid gland: Secondary | ICD-10-CM | POA: Diagnosis not present

## 2020-08-20 LAB — T4, FREE: Free T4: 0.98 ng/dL (ref 0.60–1.60)

## 2020-08-20 LAB — TSH: TSH: 0.67 u[IU]/mL (ref 0.35–4.50)

## 2020-08-20 NOTE — Progress Notes (Signed)
Patient ID: Andrea [name], female   DOB: Sep 03, 1995, 25 y.o.   MRN: [id number]   This visit occurred during the SARS-CoV-2 public health emergency.  Safety protocols were in place, including screening questions prior to the visit, additional usage of staff PPE, and extensive cleaning of exam room while observing appropriate contact time as indicated for disinfecting solutions.   HPI  Andrea [name] is a 25 y.o.-year-old female, initially referred by  Dr. 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  Last visit 3 months ago.  Interim history: She lost 15 lbs immediately after the increase in Levothyroxine. Now wt plateaued. OTW feeling well, w/o complaints.  Reviewed history: Pt. has been dx with thyroid cancer in 01/2020 after having noticed a lump in neck in 08/2019, after she gave birth to twins in 05/2019. She had a thyroid ultrasound that showed a nodule which was biopsied with inconclusive results, however, Afirma molecular marker returned suspicious.   03/18/2020: Total thyroidectomy with limited neck dissection (Dr. Harlow Asa): A. THYROID, TOTAL THYROIDECTOMY:  - Papillary thyroid carcinoma, 3.2 cm, left lobe.  - Margins not involved.  - No perithyroidal extension.  - Chronic lymphocytic thyroiditis (Hashimoto's).  - See oncology table.   B. LYMPH NODES, CENTRAL COMPARTMENT, RESECTION:  - Five benign lymph nodes (0/5).   ONCOLOGY TABLE:  THYROID GLAND:  Procedure: Thyroidectomy and central compartment lymph nodes.  Tumor Focality: Unifocal.  Tumor Site: Left lobe.  Tumor Size: 3.2 x 3 x 2 cm.  Histologic Type: Papillary thyroid carcinoma, predominantly follicular variant.  Margins: Not involved by tumor.  Angioinvasion: Not identified.  Lymphatic Invasion: Not identified.  Extrathyroidal extension: Not identified.  Regional Lymph Nodes:    Number of Lymph Nodes Involved: 0    Number of Lymph Nodes  Examined: 5    Nodal Levels Examined: Central compartment.  Pathologic Stage Classification (pTNM, AJCC 8th Edition): pT2, pN0  Representative Tumor Block: A1-A14  Comment(s): The tumor is well-circumscribed and has nuclear histologic  features consistent with papillary thyroid carcinoma. The tumor is  predominantly follicular variant with a few foci of papillary  differentiation. The nonneoplastic thyroid shows marked lymphocytic  thyroiditis.  (v4.2.0.0)   Pt denies: - feeling nodules in neck - hoarseness - dysphagia - choking - SOB with lying down  For her postsurgical hypothyroidism, she is on Levothyroxine 112 mcg (dose increased from 05/2020), taken: - fasting - with water - separated by >30 min from b'fast  - no calcium, iron, PPIs, multivitamins  - + probiotic (now off - but will restart)  I reviewed pt's thyroid tests: Lab Results  Component Value Date   TSH 0.73 07/02/2020   TSH 5.03 (H) 05/21/2020   FREET4 1.28 07/02/2020   FREET4 1.02 05/21/2020  04/08/2020: TSH 3.63  Pt describes: - + fatigue - +  weight gain initially, now lost wt: 180 lbs in 07/2019 >> 213 lbs at last visit >> 199.2 lbs now  She denies: - cold intolerance - constipation - dry skin - hair loss - depression  She has + FH of thyroid disorders in: MGM. No FH of thyroid cancer.  No h/o radiation tx to head or neck. No steroids, no ABx.  ROS: Constitutional: + See HPI Eyes: no blurry vision, no xerophthalmia ENT: no sore throat, no nodules felt in neck, no dysphagia/odynophagia, no hoarseness Cardiovascular: no CP/no SOB/no palpitations/leg swelling Respiratory: no cough/no SOB Gastrointestinal: no N/V/D/C Musculoskeletal: no muscle/joint aches Skin: no rashes, +  excessive hair growth on chin Neurological: no tremors/numbness/tingling/dizziness Psychiatric: no depression/anxiety  Past Medical History:  Diagnosis Date  . Anemia    with pregnancy  . Thyroid disease     Past Surgical History:  Procedure Laterality Date  . APPENDECTOMY    . CESAREAN SECTION MULTI-GESTATIONAL N/A 05/28/2019   Procedure: CESAREAN SECTION MULTI-GESTATIONAL;  Surgeon: [name], MD;  Location: [facility];  Service: Obstetrics;  Laterality: N/A;  . LYMPH NODE DISSECTION N/A 03/18/2020   Procedure: LIMITED LYMPH NODE DISSECTION;  Surgeon: Armandina Gemma, MD;  Location: [facility];  Service: General;  Laterality: N/A;  . THYROIDECTOMY N/A 03/18/2020   Procedure: TOTAL THYROIDECTOMY;  Surgeon: Armandina Gemma, MD;  Location: [facility];  Service: General;  Laterality: N/A;  . TONSILLECTOMY     Social History   Socioeconomic History  . Marital status: Married    Spouse name: Not on file  . Number of children: 2  . Years of education: Not on file  . Highest education level: Not on file  Occupational History  . Occupation: cosmetologist  Tobacco Use  . Smoking status: Never Smoker  . Smokeless tobacco: Never Used  Vaping Use  . Vaping Use: Never used  Substance and Sexual Activity  . Alcohol use: Never  . Drug use: Never  . Sexual activity: Not on file  Other Topics Concern  . Not on file  Social History Narrative   Had twins 05/2019.   Social Determinants of Health   Financial Resource Strain: Not on file  Food Insecurity: Not on file  Transportation Needs: Not on file  Physical Activity: Not on file  Stress: Not on file  Social Connections: Not on file  Intimate Partner Violence: Not on file   Current Outpatient Medications on File Prior to Visit  Medication Sig Dispense Refill  . levothyroxine (SYNTHROID) 112 MCG tablet Take 1 tablet (112 mcg total) by mouth daily. 45 tablet 3   No current facility-administered medications on file prior to visit.   Allergies  Allergen Reactions  . Codeine Shortness Of Breath and Rash   Family History  Problem Relation Age of Onset  . Lung cancer Maternal Grandmother   . Thyroid disease Maternal Grandmother   . Heart attack  Maternal Grandfather   . Hypertension Maternal Grandfather    PE: BP 130/82 (BP Location: Right Arm, Patient Position: Sitting, Cuff Size: Normal)   Pulse 67   Ht _0  (1.676 m)   Wt 199 lb 3.2 oz (90.4 kg)   SpO2 97%   BMI 32.15 kg/m  Wt Readings from Last 3 Encounters:  08/20/20 199 lb 3.2 oz (90.4 kg)  05/21/20 213 lb 6.4 oz (96.8 kg)  03/18/20 205 lb (93 kg)   Constitutional: overweight, in NAD Eyes: PERRLA, EOMI, no exophthalmos ENT: moist mucous membranes, no neck masses palpated, thyroidectomy scar healed, without keloid or pain on palpation, no cervical lymphadenopathy Cardiovascular: RRR, No MRG Respiratory: CTA B Gastrointestinal: abdomen soft, NT, ND, BS+ Musculoskeletal: no deformities, strength intact in all 4 Skin: moist, warm, no rashes Neurological: no tremor with outstretched hands, DTR normal in all 4  ASSESSMENT: 1. Thyroid cancer -follicular variant of papillary thyroid cancer - see HPI  2. Postsurgical Hypothyroidism  PLAN:  1. FV of PTC -Patient has  follicular variant of papillary thyroid cancer, and due to age - she is stage 1 TNM (low risk).  Also, reviewing the pathology, the tumor was well-circumscribed, without high-grade tumor features, without lymphovascular invasion and without  extrathyroidal extension.  5 lymph nodes in the central compartment were analyzed and they were negative for metastasis. -At last visit and again today we discussed that papillary thyroid cancer is a slow-growing cancer, with good prognosis -Therefore, we opted to avoid RAI treatment but we did discuss that if we need to do this in the future, this will not affect her prognosis -For now, we will follow her with thyroglobulin and antithyroglobulin antibody levels -We will check a neck U/S 1 year after the surgery (03/2021)   2.  Patient with history of total thyroidectomy for thyroid cancer, now with iatrogenic hypothyroidism on levothyroxine 100 mcg daily - latest  thyroid labs reviewed with pt >> normal: Lab Results  Component Value Date   TSH 0.73 07/02/2020   - she continues on LT4 112  mcg daily - pt feels good on this dose - she was able to lose ~15 lbs but then wt plateaued - we discussed about taking the thyroid hormone every day, with water, >30 minutes before breakfast, separated by >4 hours from acid reflux medications, calcium, iron, multivitamins. Pt. is taking it correctly. - will check thyroid tests today: TSH and fT4 - If labs are abnormal, she will need to return for repeat TFTs in 1.5 months -I will see her back in 6 months  NEEDS REFILLS - 90 days. She is interested to increase the LT4 dose if safe.  Component     Latest Ref Rng & Units 08/20/2020  Thyroglobulin     ng/mL <0.1 (L)  Comment        TSH     0.35 - 4.50 uIU/mL 0.67  T4,Free(Direct)     0.60 - 1.60 ng/dL 0.98  Thyroglobulin Ab     < or = 1 IU/mL 1  TFTs are at goal. I do not feel it is safe to increase the LT4 dose for now. Thyroglobulin undetectable while ATA antibodies are very low.    Philemon Kingdom, MD PhD [facility] Endocrinology

## 2020-08-20 NOTE — Patient Instructions (Signed)
Please stop at the lab.   Lease continue Levothyroxine 112 mcg daily.  Take the thyroid hormone every day, with water, at least 30 minutes before breakfast, separated by at least 4 hours from: - acid reflux medications - calcium - iron - multivitamins  Please return in 6 months.

## 2020-08-21 MED ORDER — LEVOTHYROXINE SODIUM 112 MCG PO TABS: 112.0000 ug | ORAL_TABLET | Freq: Every day | ORAL | 3 refills | Status: DC

## 2020-08-23 LAB — THYROGLOBULIN LEVEL: Thyroglobulin: <0.1 ng/mL — ABNORMAL LOW

## 2020-08-23 LAB — THYROGLOBULIN ANTIBODY: Thyroglobulin Ab: 1 IU/mL (ref <=1)

## 2021-02-25 ENCOUNTER — Ambulatory Visit: Payer: 59 | Admitting: Internal Medicine

## 2021-03-17 DIAGNOSIS — Z85850 Personal history of malignant neoplasm of thyroid: Secondary | ICD-10-CM | POA: Insufficient documentation

## 2021-03-17 DIAGNOSIS — Z9889 Other specified postprocedural states: Secondary | ICD-10-CM | POA: Insufficient documentation

## 2021-03-17 DIAGNOSIS — E890 Postprocedural hypothyroidism: Secondary | ICD-10-CM | POA: Insufficient documentation

## 2021-03-22 ENCOUNTER — Other Ambulatory Visit: Payer: Self-pay

## 2021-03-22 ENCOUNTER — Ambulatory Visit (INDEPENDENT_AMBULATORY_CARE_PROVIDER_SITE_OTHER): Payer: 59 | Admitting: Internal Medicine

## 2021-03-22 VITALS — BP 122/90 | HR 84 | Ht 66.0 in | Wt 208.8 lb

## 2021-03-22 DIAGNOSIS — C73 Malignant neoplasm of thyroid gland: Secondary | ICD-10-CM

## 2021-03-22 DIAGNOSIS — E890 Postprocedural hypothyroidism: Secondary | ICD-10-CM

## 2021-03-22 NOTE — Patient Instructions (Signed)
Please stop at the lab.   Lease continue Levothyroxine 112 mcg daily.  Take the thyroid hormone every day, with water, at least 30 minutes before breakfast, separated by at least 4 hours from: - acid reflux medications - calcium - iron - multivitamins  Please return in 1 year.

## 2021-03-22 NOTE — Progress Notes (Addendum)
Patient ID: [name], female   DOB: 11-20-1995, 25 y.o.   MRN: [id number]   This visit occurred during the SARS-CoV-2 public health emergency.  Safety protocols were in place, including screening questions prior to the visit, additional usage of staff PPE, and extensive cleaning of exam room while observing appropriate contact time as indicated for disinfecting solutions.   HPI  Andrea [name] is a 25 y.o.-year-old female, initially referred by  Dr. 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  Last visit 7 months ago.  Interim history: She lost 15 lbs immediately after the increase in Levothyroxine.  At last visit, however, weight plateaued.  At this visit, she is frustrated by an 18 pound weight gain.  She also has more fatigue lately.  Reviewed history: Pt. has been dx with thyroid cancer in 01/2020 after having noticed a lump in neck in 08/2019, after she gave birth to twins in 05/2019. She had a thyroid ultrasound that showed a nodule which was biopsied with inconclusive results, however, Afirma molecular marker returned suspicious.   03/18/2020: Total thyroidectomy with limited neck dissection (Dr. Harlow Asa): A. THYROID, TOTAL THYROIDECTOMY:  - Papillary thyroid carcinoma, 3.2 cm, left lobe.  - Margins not involved.  - No perithyroidal extension.  - Chronic lymphocytic thyroiditis (Hashimoto's).  - See oncology table.   B. LYMPH NODES, CENTRAL COMPARTMENT, RESECTION:  - Five benign lymph nodes (0/5).   ONCOLOGY TABLE:  THYROID GLAND:  Procedure: Thyroidectomy and central compartment lymph nodes.  Tumor Focality: Unifocal.  Tumor Site: Left lobe.  Tumor Size: 3.2 x 3 x 2 cm.  Histologic Type: Papillary thyroid carcinoma, predominantly follicular variant.  Margins: Not involved by tumor.  Angioinvasion: Not identified.  Lymphatic Invasion: Not identified.  Extrathyroidal extension: Not identified.   Regional Lymph Nodes:       Number of Lymph Nodes Involved: 0       Number of Lymph Nodes Examined: 5       Nodal Levels Examined: Central compartment.  Pathologic Stage Classification (pTNM, AJCC 8th Edition): pT2, pN0  Representative Tumor Block: A1-A14  Comment(s): The tumor is well-circumscribed and has nuclear histologic  features consistent with papillary thyroid carcinoma.  The tumor is  predominantly follicular variant with a few foci of papillary  differentiation.  The nonneoplastic thyroid shows marked lymphocytic  thyroiditis.   (v4.2.0.0)   Reviewed thyroglobulin and thyroglobulin antibodies: Lab Results  Component Value Date   THYROGLB <0.1 (L) 08/20/2020   THGAB 1 08/20/2020   Pt denies: - feeling nodules in neck - hoarseness - dysphagia - choking - SOB with lying down  For her postsurgical hypothyroidism, she is on Levothyroxine 112 mcg (dose increased 05/2020), taken: - fasting - with water - separated by >30 min from b'fast  - no calcium, iron, PPIs, multivitamins  Prev. On vitamin B1, vitamin D, probiotics - stopped. Now [employer] gummies, Mg.  I reviewed pt's thyroid tests: Lab Results  Component Value Date   TSH 0.67 08/20/2020   TSH 0.73 07/02/2020   TSH 5.03 (H) 05/21/2020   FREET4 0.98 08/20/2020   FREET4 1.28 07/02/2020   FREET4 1.02 05/21/2020  04/08/2020: TSH 3.63  She has + FH of thyroid disorders in: MGM. No FH of thyroid cancer.  No h/o radiation tx to head or neck. No steroids, no ABx.  ROS: + See HPI  Past Medical History:  Diagnosis Date   Anemia    with pregnancy  Thyroid disease    Past Surgical History:  Procedure Laterality Date   APPENDECTOMY     CESAREAN SECTION MULTI-GESTATIONAL N/A 05/28/2019   Procedure: CESAREAN SECTION MULTI-GESTATIONAL;  Surgeon: [name], MD;  Location: [facility];  Service: Obstetrics;  Laterality: N/A;   LYMPH NODE DISSECTION N/A 03/18/2020   Procedure: LIMITED LYMPH NODE  DISSECTION;  Surgeon: Armandina Gemma, MD;  Location: [facility];  Service: General;  Laterality: N/A;   THYROIDECTOMY N/A 03/18/2020   Procedure: TOTAL THYROIDECTOMY;  Surgeon: Armandina Gemma, MD;  Location: [facility];  Service: General;  Laterality: N/A;   TONSILLECTOMY     Social History   Socioeconomic History   Marital status: Married    Spouse name: Not on file   Number of children: 2   Years of education: Not on file   Highest education level: Not on file  Occupational History   Occupation: cosmetologist  Tobacco Use   Smoking status: Never   Smokeless tobacco: Never  Vaping Use   Vaping Use: Never used  Substance and Sexual Activity   Alcohol use: Never   Drug use: Never   Sexual activity: Not on file  Other Topics Concern   Not on file  Social History Narrative   Had twins 05/2019.   Social Determinants of Health   Financial Resource Strain: Not on file  Food Insecurity: Not on file  Transportation Needs: Not on file  Physical Activity: Not on file  Stress: Not on file  Social Connections: Not on file  Intimate Partner Violence: Not on file   Current Outpatient Medications on File Prior to Visit  Medication Sig Dispense Refill   levothyroxine (SYNTHROID) 112 MCG tablet Take 1 tablet (112 mcg total) by mouth daily. 90 tablet 3   No current facility-administered medications on file prior to visit.   Allergies  Allergen Reactions   Codeine Shortness Of Breath and Rash   Family History  Problem Relation Age of Onset   Lung cancer Maternal Grandmother    Thyroid disease Maternal Grandmother    Heart attack Maternal Grandfather    Hypertension Maternal Grandfather    PE: BP 122/90 (BP Location: Left Arm, Patient Position: Sitting, Cuff Size: Normal)   Pulse 84   Ht 5' 6"  (1.676 m)   Wt 208 lb 12.8 oz (94.7 kg)   SpO2 100%   BMI 33.70 kg/m  Wt Readings from Last 3 Encounters:  03/22/21 208 lb 12.8 oz (94.7 kg)  08/20/20 199 lb 3.2 oz (90.4 kg)  05/21/20 213 lb  6.4 oz (96.8 kg)   Constitutional: overweight, in NAD Eyes: PERRLA, EOMI, no exophthalmos ENT: moist mucous membranes, no neck masses palpated, thyroidectomy scar healed, without keloid or pain on palpation, no cervical lymphadenopathy Cardiovascular: RRR, No MRG Respiratory: CTA B Musculoskeletal: no deformities, strength intact in all 4 Skin: moist, warm, no rashes Neurological: no tremor with outstretched hands, DTR normal in all 4  ASSESSMENT: 1. Thyroid cancer -follicular variant of papillary thyroid cancer - see HPI  2. Postsurgical Hypothyroidism  PLAN:  1. FV of PTC -Patient with follicular variant of papillary thyroid cancer-stage I TNM (low risk) due to age at diagnosis.  Also, reviewing the pathology, the tumor was well-circumscribed, without high-grade tumor features, without lymphovascular invasion and without extrathyroidal extension.  5 lymph nodes in the central compartment were analyzed and they were negative for metastasis. -We did discuss that papillary thyroid cancer is a slow-growing cancer, with good prognosis -Therefore, we opted to avoid RAI  treatment but we did discuss that if we need to do this in the future, the delayed timing would not affect her prognosis -At last visit, we checked her thyroglobulin and antithyroglobulin antibodies and they were undetectable.  We will recheck these today. -We will check a neck ultrasound now, 1 year after surgery.  Discussed that this is standard procedure, to check for the amount of scar tissue left after surgery and any retinal masses, but not necessarily out of concern of cancer recurrence  2.  Patient with history of total thyroidectomy for thyroid cancer, now with iatrogenic hypothyroidism on levothyroxine - latest thyroid labs reviewed with pt. >> normal: Lab Results  Component Value Date   TSH 0.67 08/20/2020  - she continues on LT4 112 mcg daily - pt feels good on this dose but she gained weight and feels more  fatigued. - we discussed about taking the thyroid hormone every day, with water, >30 minutes before breakfast, separated by >4 hours from acid reflux medications, calcium, iron, multivitamins. Pt. is taking it correctly. - will check thyroid tests today: TSH and fT4 - If labs are abnormal, she will need to return for repeat TFTs in 1.5 months  Component     Latest Ref Rng & Units 03/22/2021  Thyroglobulin     ng/mL <0.1 (L)  Comment        TSH     0.35 - 5.50 uIU/mL 2.31  T4,Free(Direct)     0.60 - 1.60 ng/dL 1.16  Thyroglobulin Ab     < or = 1 IU/mL 1  Thyroglobulin is undetectable. TSH is higher than before >> will suggest to increase levothyroxine to 125 mcg daily and have her back for labs in 1.5 months.  Neck ultrasound (04/20/2021):  Isthmus: Surgically absent. There is no residual nodular soft tissue within the isthmic resection bed.   Right lobe: There is an approximately 0.8 x 0.6 x 0.3 cm hypoechoic nodule within the right lobectomy resection bed (images 24 and 25).   Left lobe: There is a questioned approximately 0.7 x 0.4 x 0.2 cm mixed echogenic ill-defined nodule within the left lobectomy resection bed (images 28 and 29). _________________________________________________________   No regional cervical lymphadenopathy.   IMPRESSION: Post total thyroidectomy with questioned punctate (approximate 0.8 cm) nodules within the right and left lobectomy resection beds, nonspecific with potential differential considerations including non pathologically enlarged cervical lymph nodes versus residual/recurrent thyroid parenchyma/disease. Correlation with thyroglobulin levels is advised.    Electronically Signed   By: Sandi Mariscal M.D.   On: 04/20/2021 16:01  Most likely residual masses in the left right thyroid fossa.  However, I would suggest to check another ultrasound in 6 months.  If growing, will plan RAI treatment.  Philemon Kingdom, MD PhD [facility]  Endocrinology

## 2021-03-23 ENCOUNTER — Encounter: Payer: Self-pay | Admitting: Internal Medicine

## 2021-03-23 LAB — THYROGLOBULIN LEVEL: Thyroglobulin: <0.1 ng/mL — ABNORMAL LOW

## 2021-03-23 LAB — THYROGLOBULIN ANTIBODY: Thyroglobulin Ab: 1 IU/mL (ref <=1)

## 2021-03-23 LAB — TSH: TSH: 2.31 u[IU]/mL (ref 0.35–5.50)

## 2021-03-23 LAB — T4, FREE: Free T4: 1.16 ng/dL (ref 0.60–1.60)

## 2021-03-23 MED ORDER — LEVOTHYROXINE SODIUM 125 MCG PO TABS: 125.0000 ug | ORAL_TABLET | Freq: Every day | ORAL | 3 refills | Status: DC

## 2021-04-06 ENCOUNTER — Other Ambulatory Visit: Payer: 59

## 2021-04-20 ENCOUNTER — Ambulatory Visit
Admission: RE | Admit: 2021-04-20 | Discharge: 2021-04-20 | Disposition: A | Discharge status: Home / Self Care | Payer: 59 | Source: Ambulatory Visit | Attending: Internal Medicine | Admitting: Internal Medicine

## 2021-04-20 DIAGNOSIS — C73 Malignant neoplasm of thyroid gland: Secondary | ICD-10-CM

## 2021-04-21 ENCOUNTER — Encounter: Payer: Self-pay | Admitting: Internal Medicine

## 2021-04-21 NOTE — Addendum Note (Signed)
Addended by: Philemon Kingdom on: 04/21/2021 12:52 PM   Modules accepted: Orders

## 2021-05-06 ENCOUNTER — Other Ambulatory Visit (INDEPENDENT_AMBULATORY_CARE_PROVIDER_SITE_OTHER): Payer: 59

## 2021-05-06 DIAGNOSIS — E890 Postprocedural hypothyroidism: Secondary | ICD-10-CM

## 2021-05-06 LAB — TSH: TSH: 0.94 u[IU]/mL (ref 0.35–5.50)

## 2021-05-06 LAB — T4, FREE: Free T4: 1.08 ng/dL (ref 0.60–1.60)

## 2021-06-29 ENCOUNTER — Telehealth: Payer: Self-pay

## 2021-06-29 DIAGNOSIS — C73 Malignant neoplasm of thyroid gland: Secondary | ICD-10-CM

## 2021-06-29 DIAGNOSIS — E890 Postprocedural hypothyroidism: Secondary | ICD-10-CM

## 2021-06-29 MED ORDER — SYNTHROID 125 MCG PO TABS: 125.0000 ug | ORAL_TABLET | Freq: Every day | ORAL | 3 refills | Status: DC

## 2021-06-29 NOTE — Telephone Encounter (Signed)
T, ?Let's try to switch to brand name Synthroid DAW 125 mcg daily.  Let's send 30 tablets with 11 refills to see if the rash improves.  If it does, she can let us know and we can send a 90-day supply.  If not, it may not be from the levothyroxine. ?

## 2021-06-29 NOTE — Telephone Encounter (Signed)
Contacted pt and advised new rx was sent to preferred pharmacy for Synthroid DAW same dose. Pt will follow up if she wants 90 day supply. ?

## 2021-06-29 NOTE — Telephone Encounter (Signed)
Pt called to advise she is still struggling with weight loss and also having some skin issues. Pt thinks it may be due to the thyroid medication she is on. Pt wanted to know if she should schedule an appt to discuss this with you or if you have any alternative medication that may yield different results. ?

## 2021-07-18 ENCOUNTER — Other Ambulatory Visit: Payer: Self-pay

## 2021-07-18 ENCOUNTER — Encounter: Payer: Self-pay | Admitting: Infectious Disease

## 2021-07-18 ENCOUNTER — Ambulatory Visit (INDEPENDENT_AMBULATORY_CARE_PROVIDER_SITE_OTHER): Payer: 59 | Admitting: Infectious Disease

## 2021-07-18 VITALS — BP 111/75 | HR 66 | Temp 98.0°F | Resp 16 | Ht 66.0 in | Wt 204.6 lb

## 2021-07-18 DIAGNOSIS — D440 Neoplasm of uncertain behavior of thyroid gland: Secondary | ICD-10-CM

## 2021-07-18 DIAGNOSIS — Z1159 Encounter for screening for other viral diseases: Secondary | ICD-10-CM

## 2021-07-18 DIAGNOSIS — R509 Fever, unspecified: Secondary | ICD-10-CM

## 2021-07-18 HISTORY — DX: Fever, unspecified: R50.9

## 2021-07-18 NOTE — Progress Notes (Signed)
? ?Subjective:  ?Reason for infectious disease consult: Fever of unknown origin ? ?Requesting Physician: Clydie Braun, FNP ? ? ? Patient ID: Andrea [name], female    DOB: 09/27/1995, 26 y.o.   MRN: [id number] ? ?HPI ? 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 ? ?Afterwards she has been having episodic fevers at times as high as 102 and even 104 ?F.  Fevers are frequently accompanied by neck pain posteriorly occasionally headaches and also pain in her joints in particular her knees and ankles. ? ?She did have doxycyline at one point in time along the way with mitten symptoms when she was being treated for presumptive tickborne infection. ? 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 ? ?Usually though she simply takes Tylenol and other antipyretics with resolution of her temperature. ? ?Work-up with PCP has been unremarkable including chest x-ray sed rate CRP CBC with differential which was normal compress a metabolic panel which was normal ANA which was negative. ? ?Her travels been confined to the [place] and between [place] and [place]. ? ?She has 2 dogs at home but has not been raised on a farm or been exposed to other animals being giving birth. Her husband is a Retail banker but she has not participated in skinning of animals or processing of carcasses.  ? ?She has one family member with SLE ? ? ? ?Past Medical History:  ?Diagnosis Date  ? Anemia   ? with pregnancy  ? FUO (fever of unknown origin) 07/18/2021  ? Thyroid disease   ? ? ?Past Surgical History:  ?Procedure Laterality Date  ? APPENDECTOMY    ? CESAREAN SECTION MULTI-GESTATIONAL N/A 05/28/2019  ? Procedure: CESAREAN SECTION  MULTI-GESTATIONAL;  Surgeon: [name], MD;  Location: [facility];  Service: Obstetrics;  Laterality: N/A;  ? LYMPH NODE DISSECTION N/A 03/18/2020  ? Procedure: LIMITED LYMPH NODE DISSECTION;  Surgeon: Armandina Gemma, MD;  Location: [facility];  Service: General;  Laterality: N/A;  ? THYROIDECTOMY N/A 03/18/2020  ? Procedure: TOTAL THYROIDECTOMY;  Surgeon: Armandina Gemma, MD;  Location: [facility];  Service: General;  Laterality: N/A;  ? TONSILLECTOMY    ? ? ?Family History  ?Problem Relation Age of Onset  ? Lung cancer Maternal Grandmother   ? Thyroid disease Maternal Grandmother   ? Heart attack Maternal Grandfather   ? Hypertension Maternal Grandfather   ? ? ?  ?Social History  ? ?Socioeconomic History  ? Marital status: Married  ?  Spouse name: Not on file  ? Number of children: 2  ? Years of education: Not on file  ? Highest education level: Not on file  ?Occupational History  ? Occupation: cosmetologist  ?Tobacco Use  ? Smoking status: Never  ? Smokeless tobacco: Never  ?Vaping Use  ? Vaping Use: Never used  ?Substance and Sexual Activity  ? Alcohol use: Never  ? Drug use: Never  ? Sexual activity: Not on file  ?Other Topics Concern  ? Not on file  ?Social History Narrative  ? Had twins 05/2019.  ? ?Social Determinants of Health  ? ?Financial Resource Strain: Not on file  ?Food Insecurity: Not on file  ?Transportation Needs: Not on file  ?Physical Activity:  Not on file  ?Stress: Not on file  ?Social Connections: Not on file  ? ? ?Allergies  ?Allergen Reactions  ? Codeine Shortness Of Breath and Rash  ? ? ? ?Current Outpatient Medications:  ?  SYNTHROID 125 MCG tablet, Take 1 tablet (125 mcg total) by mouth daily before breakfast., Disp: 30 tablet, Rfl: 3 ? ? ?Review of Systems  ?Constitutional:  Positive for fatigue and fever. Negative for activity change, appetite change, chills, diaphoresis and unexpected weight change.  ?HENT:  Negative for congestion, rhinorrhea, sinus pressure, sneezing, sore throat and trouble  swallowing.   ?Eyes:  Negative for photophobia and visual disturbance.  ?Respiratory:  Negative for cough, chest tightness, shortness of breath, wheezing and stridor.   ?Cardiovascular:  Negative for chest pain, palpitations and leg swelling.  ?Gastrointestinal:  Negative for abdominal distention, abdominal pain, anal bleeding, blood in stool, constipation, diarrhea, nausea and vomiting.  ?Genitourinary:  Negative for difficulty urinating, dysuria, flank pain and hematuria.  ?Musculoskeletal:  Positive for arthralgias. Negative for back pain, gait problem, joint swelling and myalgias.  ?Skin:  Negative for color change, pallor, rash and wound.  ?Neurological:  Negative for dizziness, tremors, syncope, speech difficulty, weakness, light-headedness and numbness.  ?Hematological:  Negative for adenopathy. Does not bruise/bleed easily.  ?Psychiatric/Behavioral:  Negative for agitation, behavioral problems, confusion, decreased concentration, dysphoric mood and sleep disturbance.   ? ?   ?Objective:  ? Physical Exam ?Constitutional:   ?   General: She is not in acute distress. ?   Appearance: Normal appearance. She is well-developed. She is not ill-appearing or diaphoretic.  ?HENT:  ?   Head: Normocephalic and atraumatic.  ?   Right Ear: Hearing and external ear normal.  ?   Left Ear: Hearing and external ear normal.  ?   Nose: No nasal deformity or rhinorrhea.  ?   Mouth/Throat:  ?   Lips: Pink. No lesions.  ?   Mouth: Mucous membranes are moist. No lacerations or oral lesions.  ?   Dentition: Normal dentition. Does not have dentures.  ?   Tongue: No lesions. Tongue does not deviate from midline.  ?   Palate: No mass and lesions.  ?   Pharynx: No pharyngeal swelling, oropharyngeal exudate, posterior oropharyngeal erythema or uvula swelling.  ?Eyes:  ?   General: No scleral icterus. ?   Conjunctiva/sclera: Conjunctivae normal.  ?   Right eye: Right conjunctiva is not injected.  ?   Left eye: Left conjunctiva is not  injected.  ?   Pupils: Pupils are equal, round, and reactive to light.  ?Neck:  ?   Vascular: No JVD.  ?Cardiovascular:  ?   Rate and Rhythm: Normal rate and regular rhythm.  ?   Heart sounds: Normal heart sounds, S1 normal and S2 normal. No murmur heard. ?  No friction rub. No gallop.  ?Pulmonary:  ?   Effort: Pulmonary effort is normal. No respiratory distress.  ?   Breath sounds: Normal breath sounds. No stridor. No wheezing, rhonchi or rales.  ?Abdominal:  ?   General: Bowel sounds are normal. There is no distension.  ?   Palpations: Abdomen is soft. There is no mass.  ?   Tenderness: There is no abdominal tenderness.  ?   Hernia: No hernia is present.  ?Musculoskeletal:     ?   General: Normal range of motion.  ?   Right shoulder: Normal.  ?   Left shoulder: Normal.  ?   Cervical back:  Normal range of motion and neck supple.  ?   Right hip: Normal.  ?   Left hip: Normal.  ?   Right knee: Normal.  ?   Left knee: Normal.  ?Lymphadenopathy:  ?   Head:  ?   Right side of head: No submandibular, preauricular or posterior auricular adenopathy.  ?   Left side of head: No submandibular, preauricular or posterior auricular adenopathy.  ?   Cervical: No cervical adenopathy.  ?   Right cervical: No superficial or deep cervical adenopathy. ?   Left cervical: No superficial or deep cervical adenopathy.  ?   Upper Body:  ?   Right upper body: No supraclavicular or axillary adenopathy.  ?   Left upper body: No supraclavicular or axillary adenopathy.  ?Skin: ?   General: Skin is warm and dry.  ?   Coloration: Skin is not pale.  ?   Findings: No abrasion, bruising, ecchymosis, erythema, lesion or rash.  ?   Nails: There is no clubbing.  ?Neurological:  ?   General: No focal deficit present.  ?   Mental Status: She is alert and oriented to person, place, and time.  ?   Sensory: No sensory deficit.  ?   Coordination: Coordination normal.  ?   Gait: Gait normal.  ?Psychiatric:     ?   Attention and Perception: She is attentive.      ?   Mood and Affect: Mood normal.     ?   Speech: Speech normal.     ?   Behavior: Behavior normal. Behavior is cooperative.     ?   Thought Content: Thought content normal.     ?   Judgment: Judgment normal.  ? ? ?

## 2021-07-19 ENCOUNTER — Other Ambulatory Visit: Payer: Self-pay | Admitting: Infectious Disease

## 2021-07-19 ENCOUNTER — Telehealth: Payer: Self-pay

## 2021-07-19 DIAGNOSIS — R748 Abnormal levels of other serum enzymes: Secondary | ICD-10-CM

## 2021-07-19 NOTE — Telephone Encounter (Signed)
-----   Message from Truman Hayward, MD sent at 07/19/2021  8:27 AM EDT ----- ?Her muscle enzymes are QUITE high. Has she done any vigorous exercise recently? If not this may be clue to an autoimmune  myositis and we may need to refer to Rheumatology ?----- Message ----- ?From: Tomi Bamberger, CMA ?Sent: 07/18/2021   2:57 PM EDT ?To: Truman Hayward, MD ? ? ?

## 2021-07-19 NOTE — Telephone Encounter (Signed)
Patient aware of results. Patient stated that she does go to the gym but she doesn't do super strenuous workouts. Patient may squat 75 lbs and lift dumbbells 5-10 lbs.  ? ? ? ?Andrea [name], CMA ? ?

## 2021-07-19 NOTE — Telephone Encounter (Signed)
Patient aware and scheduled for lab on 4/11. Dr.Van Dam can you order the lab she needs please. ? ? ?Thanks  ?

## 2021-07-24 LAB — CULTURE, BLOOD (SINGLE): MICRO NUMBER:: 13215653

## 2021-07-25 ENCOUNTER — Other Ambulatory Visit: Payer: Self-pay

## 2021-07-25 LAB — QUANTIFERON-TB GOLD PLUS
Mitogen-NIL: >10 IU/mL
NIL: 0.04 IU/mL
QuantiFERON-TB Gold Plus: NEGATIVE
TB1-NIL: <0 IU/mL
TB2-NIL: <0 IU/mL

## 2021-07-25 LAB — FERRITIN: Ferritin: 80 ng/mL (ref 16–154)

## 2021-07-25 LAB — CULTURE, BLOOD (SINGLE)
MICRO NUMBER:: 13215652
Result:: NO GROWTH
SPECIMEN QUALITY:: ADEQUATE

## 2021-07-25 LAB — CMV ABS, IGG+IGM (CYTOMEGALOVIRUS)
CMV IgM: <30 AU/mL
Cytomegalovirus Ab-IgG: <0.6 U/mL

## 2021-07-25 LAB — PROTEIN ELECTROPHORESIS, SERUM
Albumin ELP: 4.4 g/dL (ref 3.8–4.8)
Alpha 1: 0.3 g/dL (ref 0.2–0.3)
Alpha 2: 0.7 g/dL (ref 0.5–0.9)
Beta 2: 0.4 g/dL (ref 0.2–0.5)
Beta Globulin: 0.4 g/dL (ref 0.4–0.6)
Gamma Globulin: 1.2 g/dL (ref 0.8–1.7)
Total Protein: 7.4 g/dL (ref 6.1–8.1)

## 2021-07-25 LAB — HEPATITIS C ANTIBODY
Hepatitis C Ab: NONREACTIVE
SIGNAL TO CUT-OFF: <0.02 (ref <1.00)

## 2021-07-25 LAB — CK: Total CK: 866 U/L — ABNORMAL HIGH (ref 29–143)

## 2021-07-25 LAB — EPSTEIN-BARR VIRUS VCA ANTIBODY PANEL
EBV NA IgG: 346 U/mL — ABNORMAL HIGH
EBV VCA IgG: 196 U/mL — ABNORMAL HIGH
EBV VCA IgM: 45.4 U/mL — ABNORMAL HIGH

## 2021-07-25 LAB — HEPATITIS B SURFACE ANTIGEN: Hepatitis B Surface Ag: NONREACTIVE

## 2021-07-25 LAB — RHEUMATOID FACTOR: Rheumatoid fact SerPl-aCnc: <14 IU/mL (ref <14)

## 2021-07-25 LAB — ANGIOTENSIN CONVERTING ENZYME: Angiotensin-Converting Enzyme: 49 U/L (ref 9–67)

## 2021-07-25 LAB — CREATININE, SERUM: Creat: 1.01 mg/dL — ABNORMAL HIGH (ref 0.50–0.96)

## 2021-07-25 LAB — HIV ANTIBODY (ROUTINE TESTING W REFLEX): HIV 1&2 Ab, 4th Generation: NONREACTIVE

## 2021-07-25 LAB — EPSTEIN-BARR VIRUS EARLY D ANTIGEN ANTIBODY, IGG: EBV EA IgG: <9 U/mL (ref <9.00)

## 2021-07-25 LAB — ANCA SCREEN W REFLEX TITER: ANCA SCREEN: NEGATIVE

## 2021-07-25 LAB — HEPATITIS B SURFACE ANTIBODY, QUANTITATIVE: Hep B S AB Quant (Post): <5 m[IU]/mL — ABNORMAL LOW (ref >=10)

## 2021-07-26 ENCOUNTER — Ambulatory Visit (HOSPITAL_COMMUNITY)
Admission: RE | Admit: 2021-07-26 | Discharge: 2021-07-26 | Disposition: A | Discharge status: Home / Self Care | Payer: 59 | Source: Ambulatory Visit | Attending: Infectious Disease | Admitting: Infectious Disease

## 2021-07-26 ENCOUNTER — Other Ambulatory Visit: Payer: 59

## 2021-07-26 ENCOUNTER — Other Ambulatory Visit: Payer: Self-pay

## 2021-07-26 DIAGNOSIS — R509 Fever, unspecified: Secondary | ICD-10-CM | POA: Diagnosis present

## 2021-07-26 DIAGNOSIS — Z1159 Encounter for screening for other viral diseases: Secondary | ICD-10-CM | POA: Insufficient documentation

## 2021-07-26 DIAGNOSIS — R748 Abnormal levels of other serum enzymes: Secondary | ICD-10-CM

## 2021-07-26 DIAGNOSIS — D440 Neoplasm of uncertain behavior of thyroid gland: Secondary | ICD-10-CM | POA: Insufficient documentation

## 2021-07-26 MED ORDER — IOHEXOL 300 MG/ML  SOLN
100.0000 mL | Freq: Once | INTRAMUSCULAR | Status: AC | PRN
  Administered 2021-07-26: 100 mL via INTRAVENOUS

## 2021-07-27 ENCOUNTER — Telehealth: Payer: Self-pay

## 2021-07-27 LAB — CK: Total CK: 75 U/L (ref 29–143)

## 2021-07-27 NOTE — Telephone Encounter (Signed)
-----   Message from Truman Hayward, MD sent at 07/27/2021 10:49 AM EDT ----- ?CK normal prior patient must of been due to her weight lifting ?----- Message ----- ?From: Interface, [facility] Lab Results In ?Sent: 07/27/2021   1:39 AM EDT ?To: Truman Hayward, MD ? ? ?

## 2021-08-24 ENCOUNTER — Ambulatory Visit (INDEPENDENT_AMBULATORY_CARE_PROVIDER_SITE_OTHER): Payer: 59 | Admitting: Infectious Disease

## 2021-08-24 ENCOUNTER — Other Ambulatory Visit: Payer: Self-pay

## 2021-08-24 ENCOUNTER — Encounter: Payer: Self-pay | Admitting: Infectious Disease

## 2021-08-24 VITALS — BP 116/79 | HR 71 | Temp 98.2°F | Ht 66.0 in | Wt 208.0 lb

## 2021-08-24 DIAGNOSIS — R509 Fever, unspecified: Secondary | ICD-10-CM | POA: Diagnosis not present

## 2021-08-24 NOTE — Progress Notes (Signed)
? ?Subjective:  ?Chief complaint follow-up for fever unknown origin ? ? ? Patient ID: Andrea [name], female    DOB: 1995-12-23, 26 y.o.   MRN: [id number] ? ?HPI ? 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 ? ?Afterwards she has been having episodic fevers at times as high as 102 and even 104 ?F.  Fevers are frequently accompanied by neck pain posteriorly occasionally headaches and also pain in her joints in particular her knees and ankles. ? ?She did have doxycyline at one point in time along the way with mitten symptoms when she was being treated for presumptive tickborne infection. ? 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 ? ?Usually though she simply takes Tylenol and other antipyretics with resolution of her temperature. ? ?Work-up with PCP has been unremarkable including chest x-ray sed rate CRP CBC with differential which was normal compress a metabolic panel which was normal ANA which was negative. ? ?Her travels been confined to the [place] and between [place] and [place]. ? ?She has 2 dogs at home but has not been raised on a farm or been exposed to other animals being giving birth. Her husband is a Retail banker but she has not participated in skinning of animals or processing of carcasses.  ? ?She has one family member with SLE ? ?We did an extensive battery of tests including screening for HIV and viral hepatitides which were negative. ? 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  Ferritin was normal SPEP was normal rheumatoid factor normal CPK was elevated initially but that was due to her having worked out recently. ? ?CT the abdomen  pelvis has been performed and reviewed by me personally and show no evidence of infection or intra-abdominal pathology. ? ?CT of the neck was also performed which was also encouraging and showed no evidence of infection or malignancy. ? ?Since we last saw her her fevers have actually disappeared and she is feeling quite well.  She does not want further blood work done at this point time or further aggressive imaging. ? ? ? ? ? ?Past Medical History:  ?Diagnosis Date  ? Anemia   ? with pregnancy  ? FUO (fever of unknown origin) 07/18/2021  ? Thyroid disease   ? ? ?Past Surgical History:  ?Procedure Laterality Date  ? APPENDECTOMY    ? CESAREAN SECTION MULTI-GESTATIONAL N/A 05/28/2019  ? Procedure: CESAREAN SECTION MULTI-GESTATIONAL;  Surgeon: [name], MD;  Location: [facility];  Service: Obstetrics;  Laterality: N/A;  ? LYMPH NODE DISSECTION N/A 03/18/2020  ? Procedure: LIMITED LYMPH NODE DISSECTION;  Surgeon: Armandina Gemma, MD;  Location: [facility];  Service: General;  Laterality: N/A;  ? THYROIDECTOMY N/A 03/18/2020  ? Procedure: TOTAL THYROIDECTOMY;  Surgeon: Armandina Gemma, MD;  Location: [facility];  Service: General;  Laterality: N/A;  ? TONSILLECTOMY    ? ? ?Family History  ?Problem Relation Age of Onset  ? Lung cancer Maternal Grandmother   ? Thyroid disease Maternal Grandmother   ? Heart attack Maternal Grandfather   ? Hypertension Maternal Grandfather   ? ? ?  ?Social History  ? ?Socioeconomic History  ? Marital status: Married  ?  Spouse name: Not on file  ? Number of children: 2  ? Years of education: Not on file  ? Highest education level: Not on file  ?Occupational History  ? Occupation: cosmetologist  ?Tobacco Use  ? Smoking status: Never  ? Smokeless tobacco: Never  ?Vaping Use  ? Vaping Use: Never used  ?Substance and Sexual Activity  ? Alcohol use: Never  ? Drug use: Never  ? Sexual activity: Not on file  ?Other Topics Concern  ? Not on file  ?Social History Narrative  ? Had twins 05/2019.  ? ?Social  Determinants of Health  ? ?Financial Resource Strain: Not on file  ?Food Insecurity: Not on file  ?Transportation Needs: Not on file  ?Physical Activity: Not on file  ?Stress: Not on file  ?Social Connections: Not on file  ? ? ?Allergies  ?Allergen Reactions  ? Codeine Shortness Of Breath and Rash  ? ? ? ?Current Outpatient Medications:  ?  SYNTHROID 125 MCG tablet, Take 1 tablet (125 mcg total) by mouth daily before breakfast., Disp: 30 tablet, Rfl: 3 ? ? ?Review of Systems  ?Constitutional:  Positive for fatigue. Negative for activity change, appetite change, chills, diaphoresis, fever and unexpected weight change.  ?HENT:  Negative for congestion, rhinorrhea, sinus pressure, sneezing, sore throat and trouble swallowing.   ?Eyes:  Negative for photophobia and visual disturbance.  ?Respiratory:  Negative for cough, chest tightness, shortness of breath, wheezing and stridor.   ?Cardiovascular:  Negative for chest pain, palpitations and leg swelling.  ?Gastrointestinal:  Negative for abdominal distention, abdominal pain, anal bleeding, blood in stool, constipation, diarrhea, nausea and vomiting.  ?Genitourinary:  Negative for difficulty urinating, dysuria, flank pain and hematuria.  ?Musculoskeletal:  Negative for arthralgias, back pain, gait problem, joint swelling and myalgias.  ?Skin:  Negative for color change, pallor, rash and wound.  ?Neurological:  Negative for dizziness, tremors, weakness and light-headedness.  ?Hematological:  Negative for adenopathy. Does not bruise/bleed easily.  ?Psychiatric/Behavioral:  Negative for agitation, behavioral problems, confusion, decreased concentration, dysphoric mood and sleep disturbance.   ? ?   ?Objective:  ? Physical Exam ?Constitutional:   ?   General: She is not in acute distress. ?   Appearance: Normal appearance. She is well-developed. She is not ill-appearing or diaphoretic.  ?HENT:  ?   Head: Normocephalic and atraumatic.  ?   Right Ear: Hearing and external ear  normal.  ?   Left Ear: Hearing and external ear normal.  ?   Nose: No nasal deformity or rhinorrhea.  ?Eyes:  ?   General: No scleral icterus. ?   Conjunctiva/sclera: Conjunctivae normal.  ?   Right eye: Right conjunctiva is not injected.  ?   Left eye: Left conjunctiva is not injected.  ?   Pupils: Pupils are equal, round, and reactive to light.  ?Neck:  ?   Vascular: No JVD.  ?Cardiovascular:  ?   Rate and Rhythm: Normal rate and regular rhythm.  ?   Heart sounds: S1 normal and S2 normal.  ?Pulmonary:  ?   Effort: Pulmonary effort is normal. No respiratory distress.  ?   Breath sounds: No wheezing.  ?Abdominal:  ?   General: There is no distension.  ?   Palpations: Abdomen is soft.  ?Musculoskeletal:     ?   General: Normal range of motion.  ?   Right shoulder: Normal.  ?   Left shoulder: Normal.  ?   Cervical back: Normal range of motion and neck  supple.  ?   Right hip: Normal.  ?   Left hip: Normal.  ?   Right knee: Normal.  ?   Left knee: Normal.  ?Lymphadenopathy:  ?   Head:  ?   Right side of head: No submandibular, preauricular or posterior auricular adenopathy.  ?   Left side of head: No submandibular, preauricular or posterior auricular adenopathy.  ?   Cervical: No cervical adenopathy.  ?   Right cervical: No superficial or deep cervical adenopathy. ?   Left cervical: No superficial or deep cervical adenopathy.  ?Skin: ?   General: Skin is warm and dry.  ?   Coloration: Skin is not pale.  ?   Findings: No abrasion, bruising, ecchymosis, erythema, lesion or rash.  ?   Nails: There is no clubbing.  ?Neurological:  ?   General: No focal deficit present.  ?   Mental Status: She is alert and oriented to person, place, and time.  ?   Sensory: No sensory deficit.  ?   Coordination: Coordination normal.  ?   Gait: Gait normal.  ?Psychiatric:     ?   Attention and Perception: She is attentive.     ?   Mood and Affect: Mood normal.     ?   Speech: Speech normal.     ?   Behavior: Behavior normal. Behavior is  cooperative.     ?   Thought Content: Thought content normal.     ?   Judgment: Judgment normal.  ? ? ? ? ? ?   ?Assessment & Plan:  ? ?Fever of unknown origin: Fever has resolved and work-up so far has been [employer]

## 2021-09-16 ENCOUNTER — Other Ambulatory Visit: Payer: Self-pay | Admitting: Internal Medicine

## 2021-09-16 DIAGNOSIS — E890 Postprocedural hypothyroidism: Secondary | ICD-10-CM

## 2021-09-19 ENCOUNTER — Ambulatory Visit
Admission: RE | Admit: 2021-09-19 | Discharge: 2021-09-19 | Disposition: A | Discharge status: Home / Self Care | Payer: 59 | Source: Ambulatory Visit | Attending: Internal Medicine | Admitting: Internal Medicine

## 2021-09-19 DIAGNOSIS — C73 Malignant neoplasm of thyroid gland: Secondary | ICD-10-CM

## 2021-10-21 ENCOUNTER — Other Ambulatory Visit: Payer: Self-pay | Admitting: Internal Medicine

## 2021-10-21 ENCOUNTER — Telehealth: Payer: Self-pay | Admitting: Internal Medicine

## 2021-10-21 DIAGNOSIS — C73 Malignant neoplasm of thyroid gland: Secondary | ICD-10-CM

## 2021-10-21 DIAGNOSIS — E890 Postprocedural hypothyroidism: Secondary | ICD-10-CM

## 2021-10-21 NOTE — Telephone Encounter (Signed)
Patient called and is requesting records or referral to be sent to Dr Alanson Aly @ [facility] ph# [phone]  fax # [phone]

## 2021-10-21 NOTE — Telephone Encounter (Signed)
Did she mention why?  I placed the referral.

## 2021-10-21 NOTE — Telephone Encounter (Signed)
Patient called and is requesting records or referral to be sent to

## 2021-10-25 NOTE — Telephone Encounter (Signed)
Patient indicated that this office was easier to get to for her/closer

## 2022-02-03 ENCOUNTER — Encounter: Payer: Self-pay | Admitting: Internal Medicine

## 2022-02-03 ENCOUNTER — Ambulatory Visit (INDEPENDENT_AMBULATORY_CARE_PROVIDER_SITE_OTHER): Payer: 59 | Admitting: Internal Medicine

## 2022-02-03 VITALS — BP 110/80 | HR 88 | Ht 66.0 in | Wt 219.6 lb

## 2022-02-03 DIAGNOSIS — C73 Malignant neoplasm of thyroid gland: Secondary | ICD-10-CM

## 2022-02-03 DIAGNOSIS — E890 Postprocedural hypothyroidism: Secondary | ICD-10-CM

## 2022-02-03 LAB — T4, FREE: Free T4: 1.41 ng/dL (ref 0.60–1.60)

## 2022-02-03 LAB — TSH: TSH: 0.28 u[IU]/mL — ABNORMAL LOW (ref 0.35–5.50)

## 2022-02-03 NOTE — Patient Instructions (Signed)
Please stop at the lab.   Lease continue Levothyroxine 125 mcg daily.  Take the thyroid hormone every day, with water, at least 30 minutes before breakfast, separated by at least 4 hours from: - acid reflux medications - calcium - iron - multivitamins  Please return in 1 year.

## 2022-02-03 NOTE — Progress Notes (Unsigned)
Patient ID: Andrea [name], female   DOB: [birth year], 26 y.o.   MRN: [id number]   HPI  Andrea [name] is a 26 y.o.-year-old female, initially referred by  Dr. 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  Last visit 10 months ago.  Interim history: At last visit, she was frustrated about the 18 pound weight gain and she also had more fatigue.  Previously, after increasing dose of levothyroxine, she lost 16 pounds. However, since last visit, despite increasing the dose of levothyroxine, she gained 11 pounds. She also feels fatigued, has hair thinning and brittle nails.  She is exercising and watching her diet.  Reviewed history: Pt. has been dx with thyroid cancer in 01/2020 after having noticed a lump in neck in 08/2019, after she gave birth to twins in 05/2019.  She had a thyroid ultrasound that showed a nodule which was biopsied with inconclusive results, however, Afirma molecular marker returned suspicious.   Total thyroidectomy with limited neck dissection (03/18/2020) (Dr. Harlow Asa): A. THYROID, TOTAL THYROIDECTOMY:  - Papillary thyroid carcinoma, 3.2 cm, left lobe.  - Margins not involved.  - No perithyroidal extension.  - Chronic lymphocytic thyroiditis (Hashimoto's).  - See oncology table.   B. LYMPH NODES, CENTRAL COMPARTMENT, RESECTION:  - Five benign lymph nodes (0/5).   ONCOLOGY TABLE:  THYROID GLAND:  Procedure: Thyroidectomy and central compartment lymph nodes.  Tumor Focality: Unifocal.  Tumor Site: Left lobe.  Tumor Size: 3.2 x 3 x 2 cm.  Histologic Type: Papillary thyroid carcinoma, predominantly follicular variant.  Margins: Not involved by tumor.  Angioinvasion: Not identified.  Lymphatic Invasion: Not identified.  Extrathyroidal extension: Not identified.  Regional Lymph Nodes:       Number of Lymph Nodes Involved: 0       Number of Lymph Nodes Examined: 5       Nodal Levels Examined: Central  compartment.  Pathologic Stage Classification (pTNM, AJCC 8th Edition): pT2, pN0  Representative Tumor Block: A1-A14  Comment(s): The tumor is well-circumscribed and has nuclear histologic  features consistent with papillary thyroid carcinoma.  The tumor is  predominantly follicular variant with a few foci of papillary  differentiation.  The nonneoplastic thyroid shows marked lymphocytic  thyroiditis.   ([id number])   Neck ultrasound (04/20/2021): Isthmus: Surgically absent. There is no residual nodular soft tissue within the isthmic resection bed.   Right lobe: There is an approximately 0.8 x 0.6 x 0.3 cm hypoechoic nodule within the right lobectomy resection bed (images 24 and 25).   Left lobe: There is a questioned approximately 0.7 x 0.4 x 0.2 cm mixed echogenic ill-defined nodule within the left lobectomy resection bed (images 28 and 29). _________________________________________________________   No regional cervical lymphadenopathy.   IMPRESSION: Post total thyroidectomy with questioned punctate (approximate 0.8 cm) nodules within the right and left lobectomy resection beds, nonspecific with potential differential considerations including non pathologically enlarged cervical lymph nodes versus residual/recurrent thyroid parenchyma/disease. Correlation with thyroglobulin levels is advised.  CT neck (07/26/2021):  Thyroid: Post thyroidectomy. Streak artifact from multiple surgical clips in this region. No apparent nodular soft tissue in the surgical bed. Lymph nodes: No enlarged or abnormal density nodes.    Neck U/S (09/19/2021): Remote thyroidectomy changes. Similar right thyroid bed hypoechoic nodule measures 8 x 5 x 3 mm.  Questionable left thyroid bed similar 8 x 4 x 4 mm hypoechoic nodule.  Certainly no new or enlarging thyroid bed abnormality or new nodularity.  Benign  cervical lymph nodes noted.  No definite bulky adenopathy.   IMPRESSION: Status post total  thyroidectomy. Stable subcentimeter bilateral thyroid bed hypoechoic indeterminate nodules as before. No new or enlarging thyroid bed abnormality. No bulky adenopathy.   Reviewed thyroglobulin and thyroglobulin antibodies: Lab Results  Component Value Date   THYROGLB <0.1 (L) 03/22/2021   THYROGLB <0.1 (L) 08/20/2020   THGAB 1 03/22/2021   THGAB 1 08/20/2020   Pt denies: - feeling nodules in neck - hoarseness - dysphagia - choking  For her postsurgical hypothyroidism, she is on Levothyroxine 125 mcg (dose increased 03/2021), taken: - fasting - with water - separated by >30 min from b'fast  - no calcium, iron, PPIs, multivitamins  Prev. On vitamin B1, vitamin D, probiotics - stopped. Now [employer] gummies, Mg.  I reviewed pt's thyroid tests: Lab Results  Component Value Date   TSH 0.94 05/06/2021   TSH 2.31 03/22/2021   TSH 0.67 08/20/2020   TSH 0.73 07/02/2020   TSH 5.03 (H) 05/21/2020   FREET4 1.08 05/06/2021   FREET4 1.16 03/22/2021   FREET4 0.98 08/20/2020   FREET4 1.28 07/02/2020   FREET4 1.02 05/21/2020  04/08/2020: TSH 3.63  She has + FH of thyroid disorders in: MGM. No FH of thyroid cancer.  No h/o radiation tx to head or neck. No steroids, no ABx.  ROS: + See HPI  Past Medical History:  Diagnosis Date   Anemia    with pregnancy   FUO (fever of unknown origin) 07/18/2021   Thyroid disease    Past Surgical History:  Procedure Laterality Date   APPENDECTOMY     CESAREAN SECTION MULTI-GESTATIONAL N/A 05/28/2019   Procedure: CESAREAN SECTION MULTI-GESTATIONAL;  Surgeon: [name], MD;  Location: [facility];  Service: Obstetrics;  Laterality: N/A;   LYMPH NODE DISSECTION N/A 03/18/2020   Procedure: LIMITED LYMPH NODE DISSECTION;  Surgeon: Armandina Gemma, MD;  Location: [facility];  Service: General;  Laterality: N/A;   THYROIDECTOMY N/A 03/18/2020   Procedure: TOTAL THYROIDECTOMY;  Surgeon: Armandina Gemma, MD;  Location: [facility];  Service: General;   Laterality: N/A;   TONSILLECTOMY     Social History   Socioeconomic History   Marital status: Married    Spouse name: Not on file   Number of children: 2   Years of education: Not on file   Highest education level: Not on file  Occupational History   Occupation: cosmetologist  Tobacco Use   Smoking status: Never   Smokeless tobacco: Never  Vaping Use   Vaping Use: Never used  Substance and Sexual Activity   Alcohol use: Never   Drug use: Never   Sexual activity: Not on file  Other Topics Concern   Not on file  Social History Narrative   Had twins 05/2019.   Social Determinants of Health   Financial Resource Strain: Not on file  Food Insecurity: Not on file  Transportation Needs: Not on file  Physical Activity: Not on file  Stress: Not on file  Social Connections: Not on file  Intimate Partner Violence: Not on file   Current Outpatient Medications on File Prior to Visit  Medication Sig Dispense Refill   SYNTHROID 125 MCG tablet TAKE 1 TABLET BY [place]. 90 tablet 1   No current facility-administered medications on file prior to visit.   Allergies  Allergen Reactions   Codeine Shortness Of Breath and Rash   Family History  Problem Relation Age of Onset   Lung cancer Maternal  Grandmother    Thyroid disease Maternal Grandmother    Heart attack Maternal Grandfather    Hypertension Maternal Grandfather    PE: BP 110/80 (BP Location: Left Arm, Patient Position: Sitting, Cuff Size: Normal)   Pulse 88   Ht 5' 6" (1.676 m)   Wt 219 lb 9.6 oz (99.6 kg)   SpO2 99%   BMI 35.44 kg/m  Wt Readings from Last 3 Encounters:  02/03/22 219 lb 9.6 oz (99.6 kg)  08/24/21 208 lb (94.3 kg)  07/18/21 204 lb 9.6 oz (92.8 kg)   Constitutional: overweight, in NAD Eyes: EOMI, no exophthalmos ENT: no neck masses palpated, thyroidectomy scar healed, without keloid or pain on palpation, no cervical lymphadenopathy Cardiovascular: RRR, No MRG Respiratory: CTA  B Musculoskeletal: no deformities Skin: no rashes Neurological: no tremor with outstretched hands  ASSESSMENT: 1. Thyroid cancer -follicular variant of papillary thyroid cancer - see HPI  2. Postsurgical Hypothyroidism  PLAN:  1 reviewing the [facility] -Patient with follicular variant of papillary thyroid cancer-stage I TNM (low risk) due to age at diagnosis and lack of metastasis.  Also, reviewing the pathology, the tumor was well-circumscribed, without high-grade tumor features, without intravascular invasion and without extrathyroidal extension.  5 out of 5 lymph nodes were negative for metastases. -We discussed at previous visits that papillary thyroid cancer is a slow-growing cancer with good prognosis >> we opted to follow her without RAI treatment -At last visit her thyroglobulin level was undetectable, as were her antithyroglobulin antibodies >> we will recheck these today.  If thyroglobulin increasing, we can proceed with RAI treatment. -We checked her thyroid ultrasound at last visit, approximately 1 year after her surgery.  This showed small, punctate, approximately 0.8 cm nodules within the right and left lobectomy resection beds, nonspecific.  The neck CT (07/2021) did not show any masses in her thyroidectomy bed.  An ultrasound was repeated on 09/19/2021, 4 months ago, and these masses were stable, most likely related to thyroid remnants or scar tissue -She has no neck compression symptoms - will see her back in 1 year  2.  Patient with history of total thyroidectomy for thyroid cancer, now with iatrogenic hypothyroidism on levothyroxine - latest thyroid labs reviewed with pt. >> normal: Lab Results  Component Value Date   TSH 0.94 05/06/2021  - she continues on LT4 125 mcg daily.  She is on Synthroid d.a.w., however, she did not feel a difference after changing to brand name so she would like refills on generic levothyroxine from now on. - pt feels good on this dose but has  fatigue and hair thinning. She also gained 11 lbs. She is very frustrated about her weight gain despite exercise and watching her diet.  She is wondering about checking T3 and I explained why this is not going to make a difference in her hypothyroidism treatment.  We discussed that she may want to establish care with a weight management clinic. - we discussed about taking the thyroid hormone every day, with water, >30 minutes before breakfast, separated by >4 hours from acid reflux medications, calcium, iron, multivitamins. Pt. is taking it correctly. - will check thyroid tests today: TSH and fT4 - If labs are abnormal, she will need to return for repeat TFTs in 1.5 months  Needs refills - Generic.  Component     Latest Ref Rng 02/03/2022  TSH     0.35 - 5.50 uIU/mL 0.28 (L)   T4,Free(Direct)     0.60 - 1.60  ng/dL 1.41   Thyroglobulin     ng/mL <0.1 (L)   Comment --   Thyroglobulin Ab     < or = 1 IU/mL 1   TSH is slightly low, however, for now, I would like to keep her on the same dose of levothyroxine but recheck her TFTs in 2 months.  If at that time the TSH is still low, we may need to back off the dose to 112 mcg daily.  Philemon Kingdom, MD PhD [facility]

## 2022-02-06 LAB — THYROGLOBULIN LEVEL: Thyroglobulin: <0.1 ng/mL — ABNORMAL LOW

## 2022-02-06 LAB — THYROGLOBULIN ANTIBODY: Thyroglobulin Ab: 1 IU/mL (ref <=1)

## 2022-03-24 ENCOUNTER — Ambulatory Visit: Payer: 59 | Admitting: Internal Medicine

## 2022-04-29 IMAGING — CT CT NECK W/ CM
3 of 5 series · 11 of 33 positions shown, 13 images · IV contrast (agent unspecified)
Comparison: None.

CLINICAL DATA: Thyroid cancer, assess treatment response

EXAM:
CT NECK WITH CONTRAST
TECHNIQUE: Multidetector CT imaging of the neck was performed using the
standard protocol following the bolus administration of intravenous
contrast.

[Series 4: axial bone · axial · 0.38mm/px · z∈[+1581,+1707]mm · 3 of 127 slices shown, 4 images]
[im 32/127  soft-tissue]
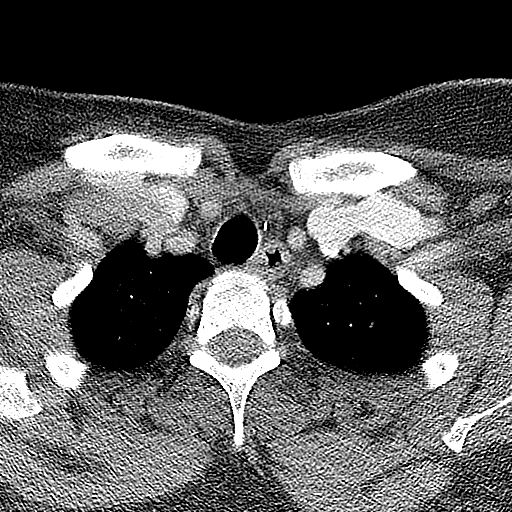
[im 32/127  bone]
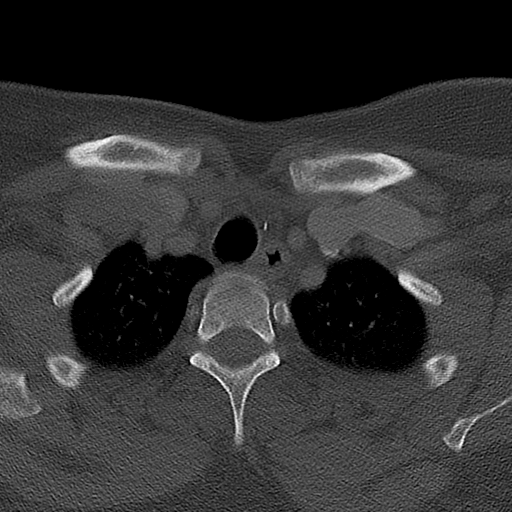
[im 64/127  bone]
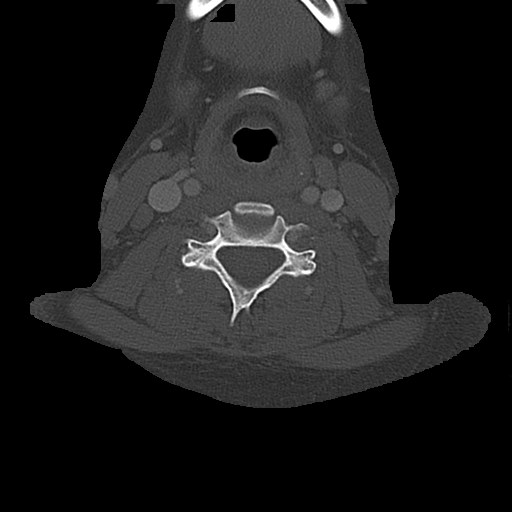
[im 95/127  bone]
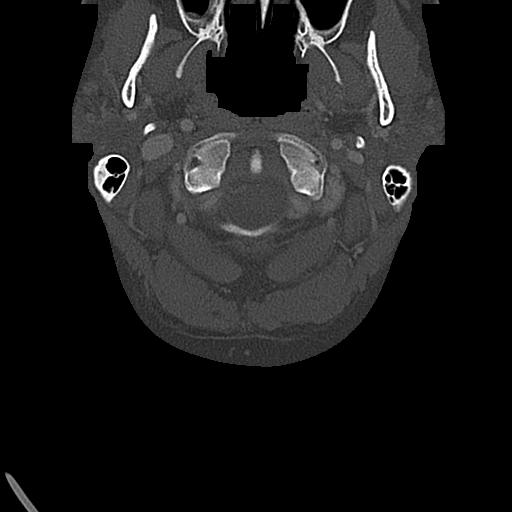

[Series 6: cor neck · coronal · 0.37mm/px · 3 of 89 slices shown]
[im 27/89  bone]
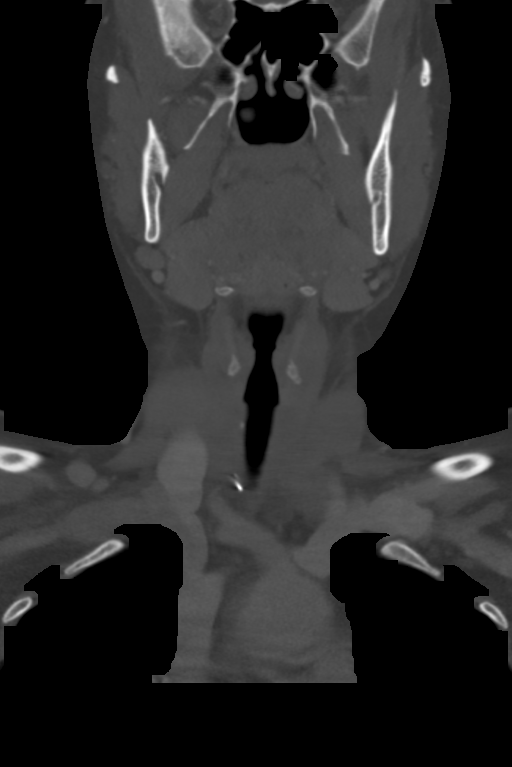
[im 39/89  bone]
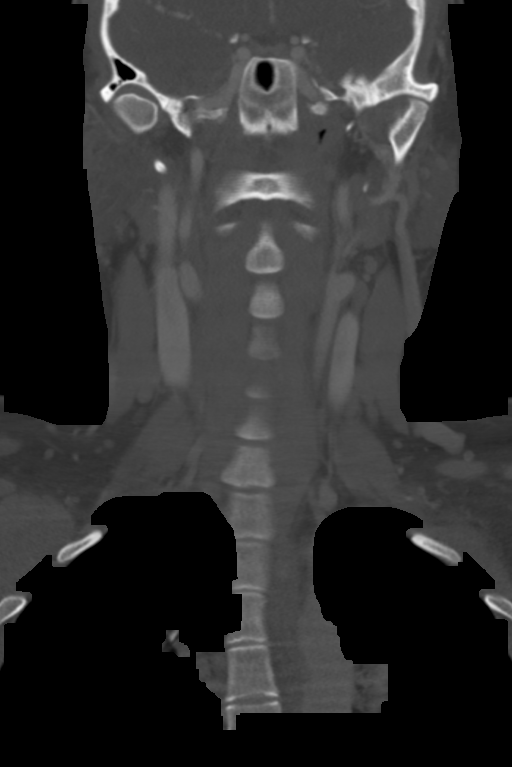
[im 51/89  bone]
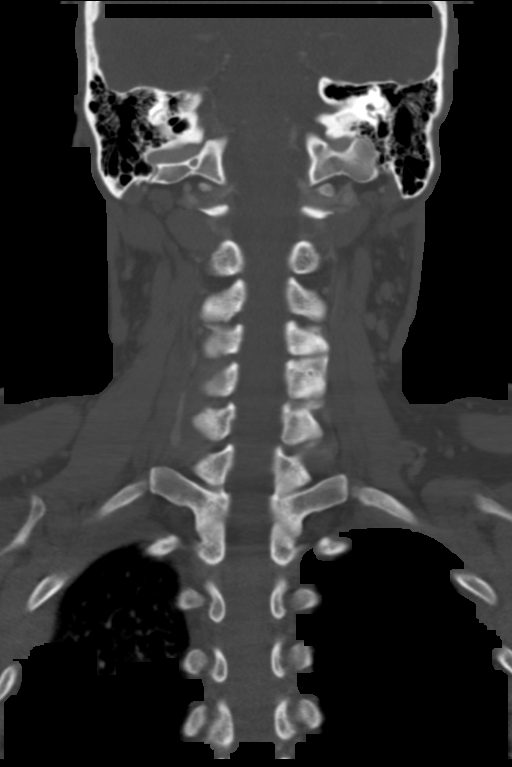

[Series 7: sag neck · sagittal · 0.33mm/px · 5 of 101 slices shown, 6 images]
[im 34/101  bone]
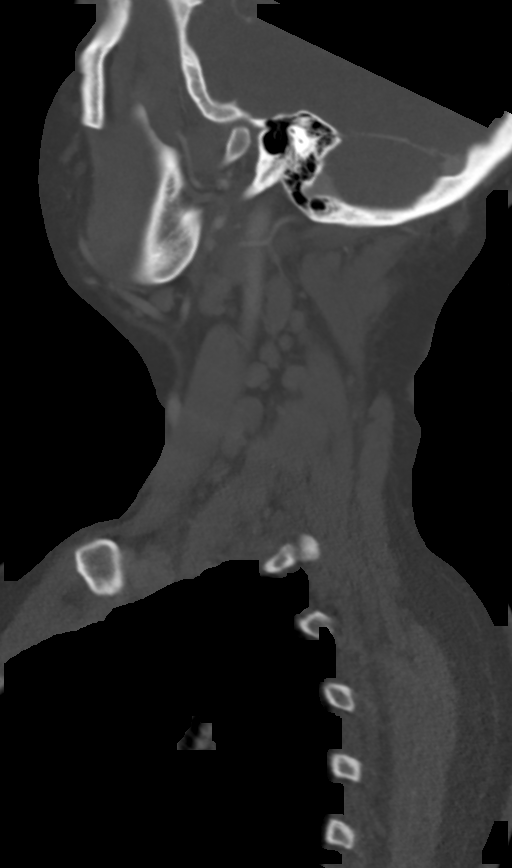
[im 42/101  bone]
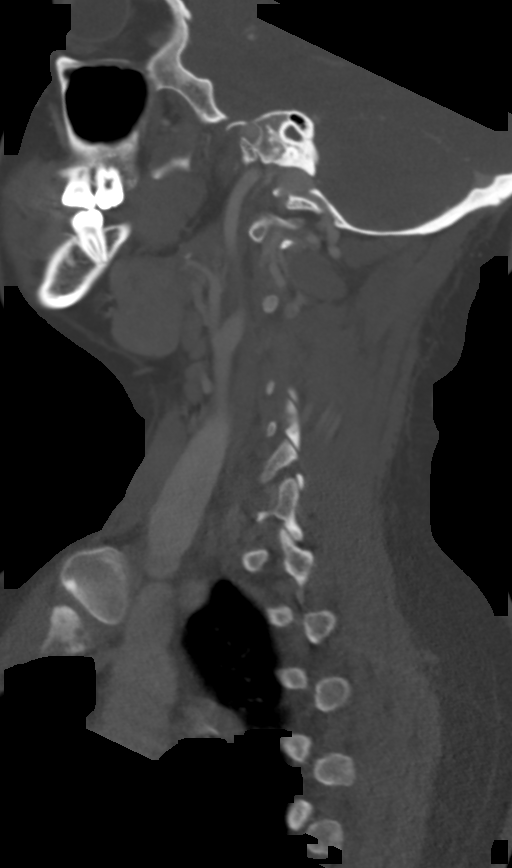
[im 51/101  soft-tissue]
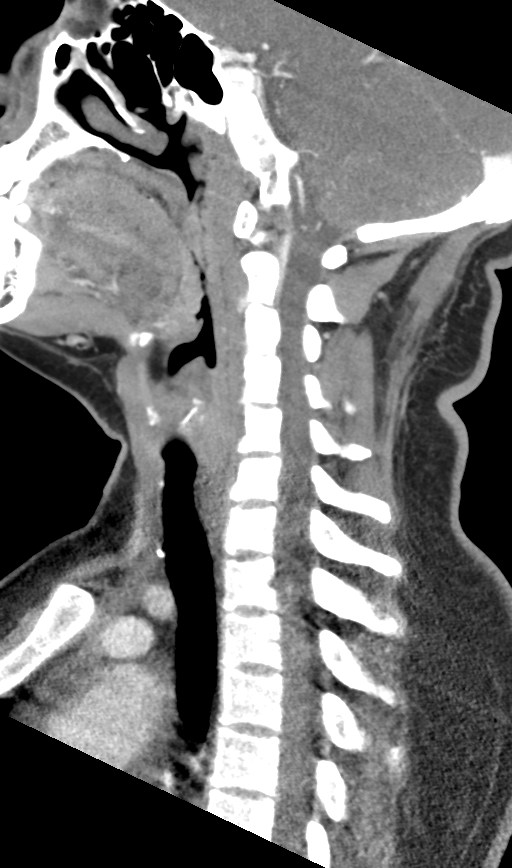
[im 51/101  bone]
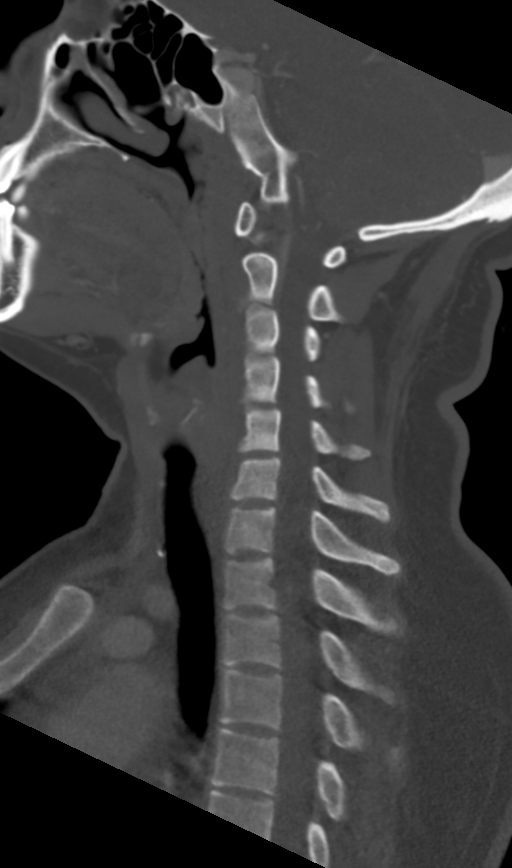
[im 59/101  bone]
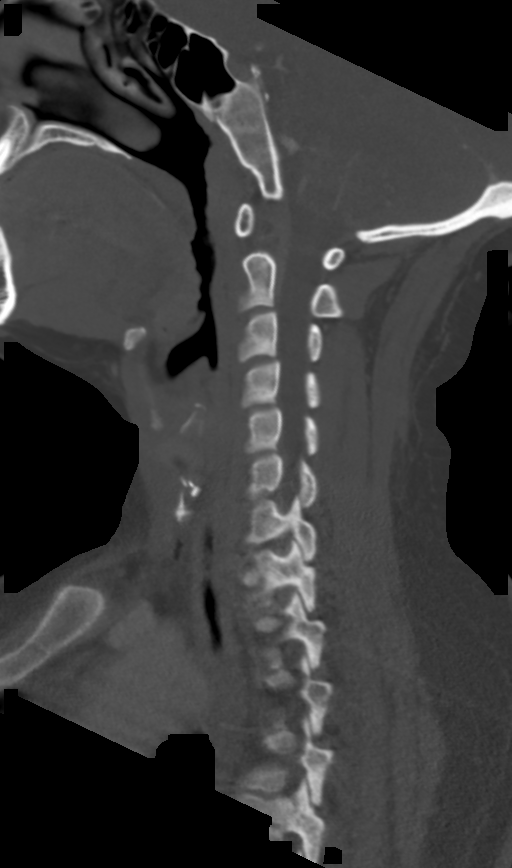
[im 67/101  bone]
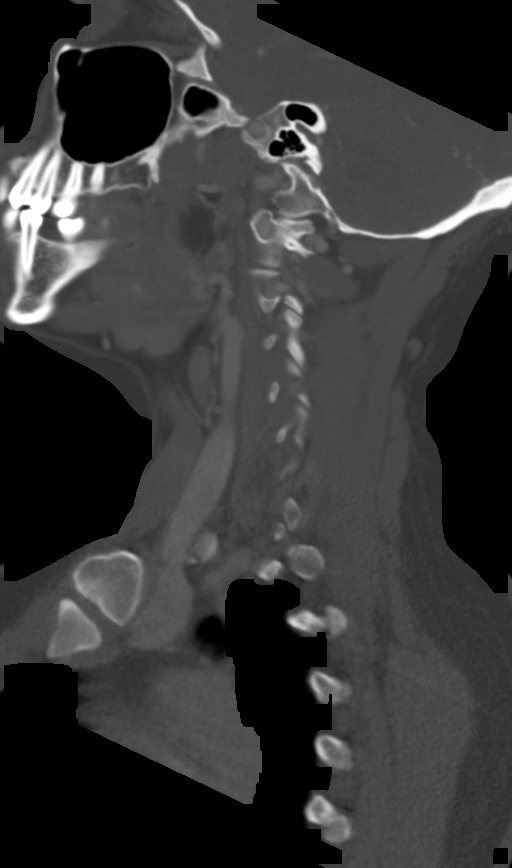

[11 of 33 positions shown; findings below may reference images not displayed]

RADIATION DOSE REDUCTION: This exam was performed according to the
departmental dose-optimization program which includes automated
exposure control, adjustment of the mA and/or kV according to
patient size and/or use of iterative reconstruction technique.

CONTRAST:  100mL OMNIPAQUE IOHEXOL 300 MG/ML  SOLN
FINDINGS: Pharynx and larynx: Unremarkable.  No mass or swelling.

Salivary glands: Parotid and submandibular glands are unremarkable.

Thyroid: Post thyroidectomy. Streak artifact from multiple surgical
clips in this region. No apparent nodular soft tissue in the
surgical bed.

Lymph nodes: No enlarged or abnormal density nodes.

Vascular: Major neck vessels are patent.

Limited intracranial: No abnormal enhancement.

Visualized orbits: Unremarkable.

Mastoids and visualized paranasal sinuses: No significant
opacification.

Skeleton: No significant osseous abnormality.

Upper chest: No apical lung mass.

Other: None.
IMPRESSION: No mass or adenopathy.

## 2022-06-23 IMAGING — US US THYROID
1 series · 14 of 25 positions shown · non-contrast
Comparison: 04/20/2021

CLINICAL DATA: History of papillary thyroid cancer, status post
thyroidectomy,

EXAM:
THYROID ULTRASOUND
TECHNIQUE: Ultrasound examination of the thyroid gland and adjacent soft
tissues was performed.

[Series 1: us thyroid · 0.06mm/px · 14 of 56 slices shown]
[im 1/56]
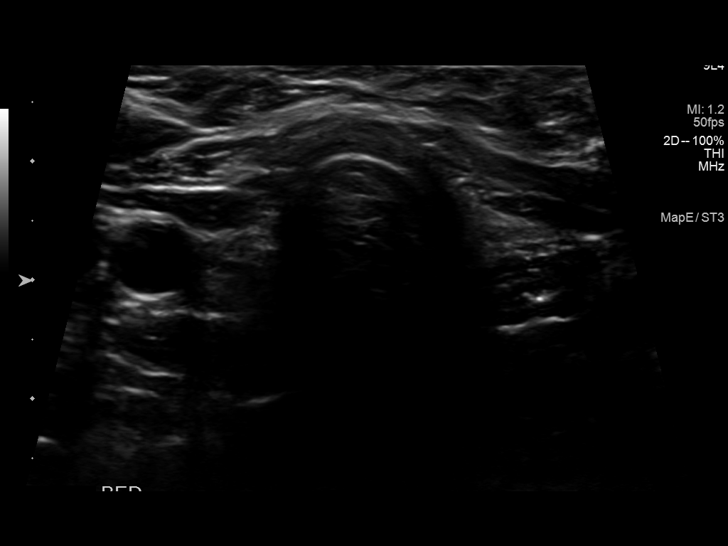
[im 5/56]
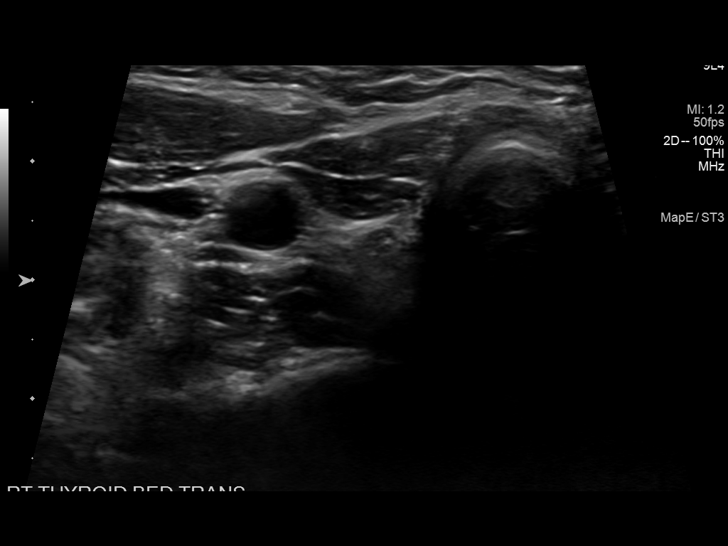
[im 10/56]
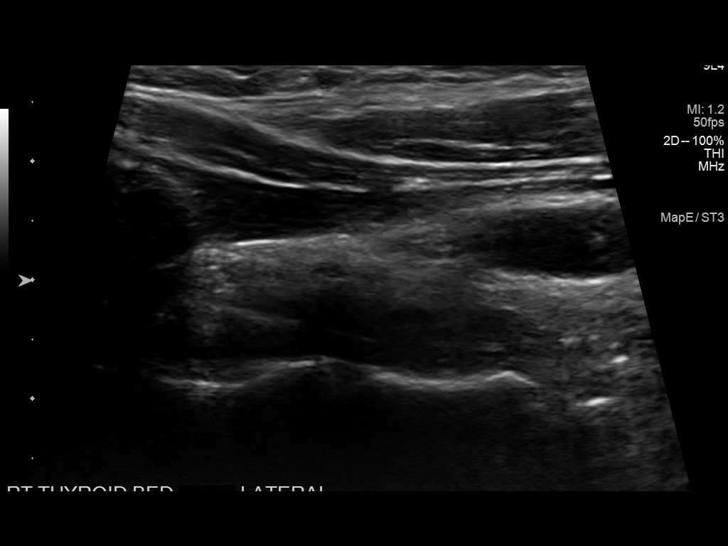
[im 14/56]
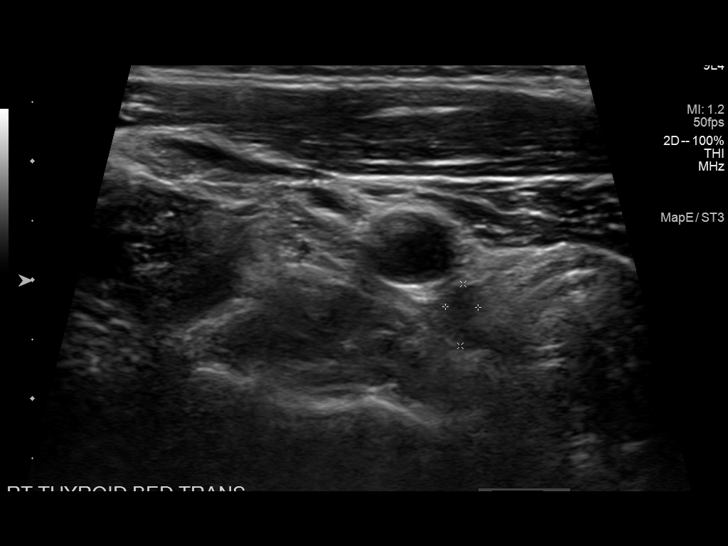
[im 19/56]
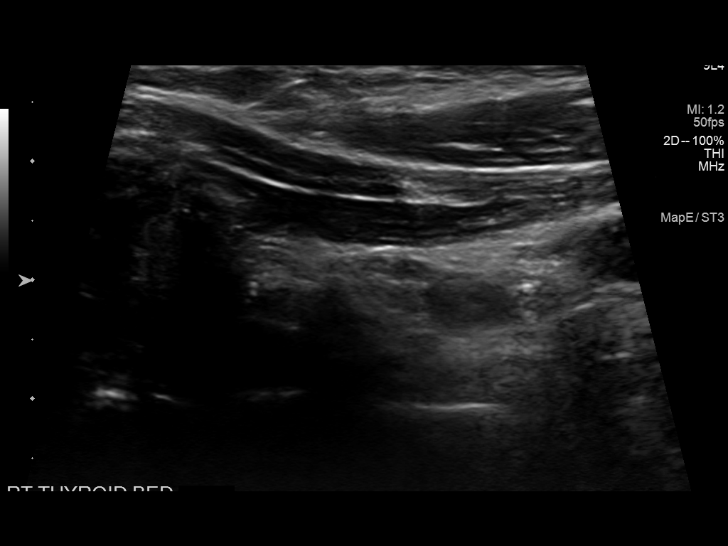
[im 21/56]
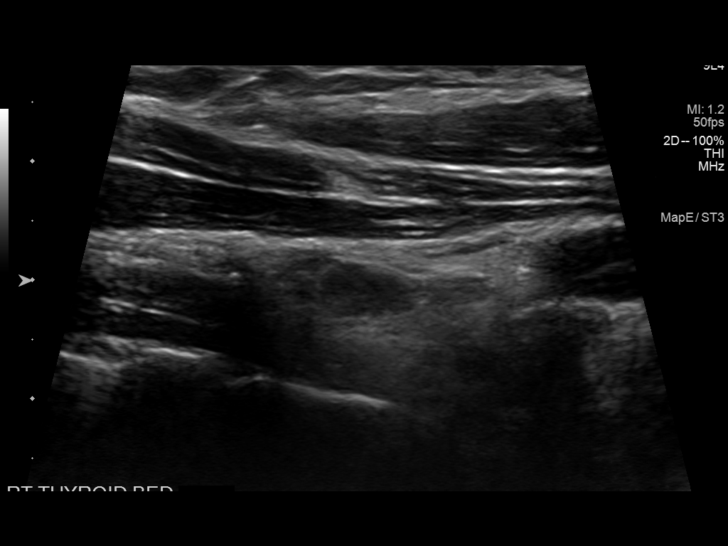
[im 26/56]
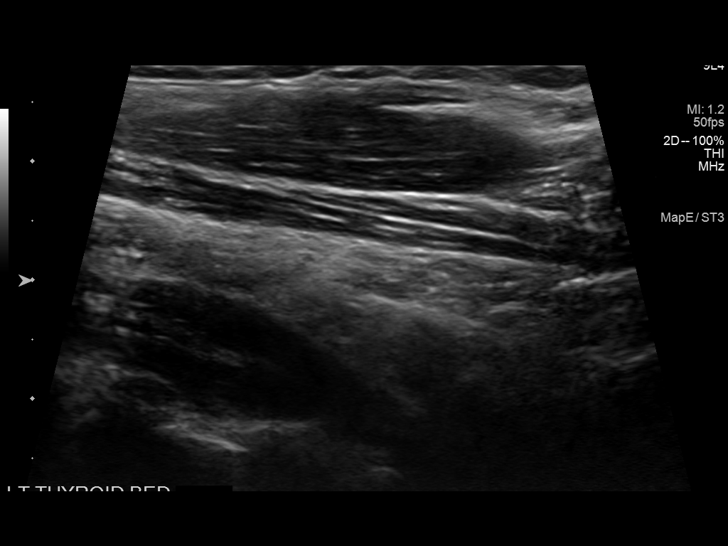
[im 30/56]
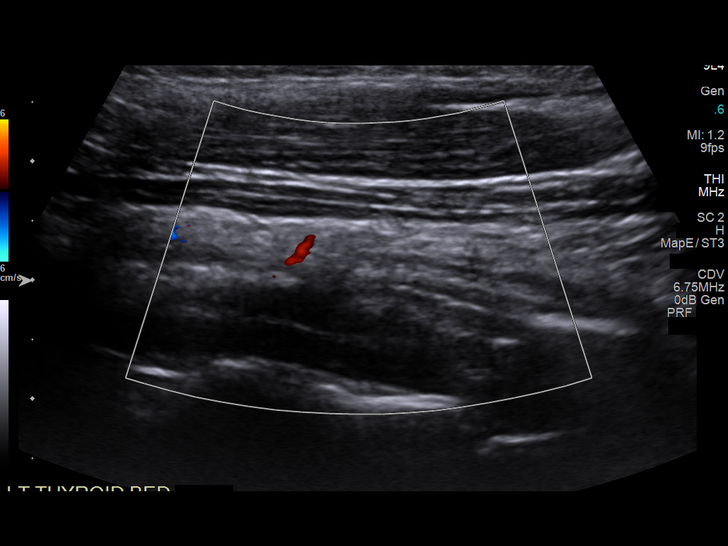
[im 35/56]
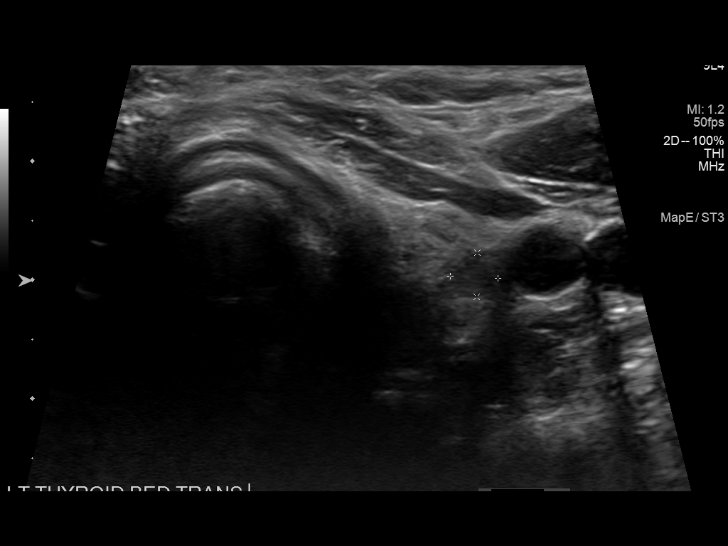
[im 37/56]
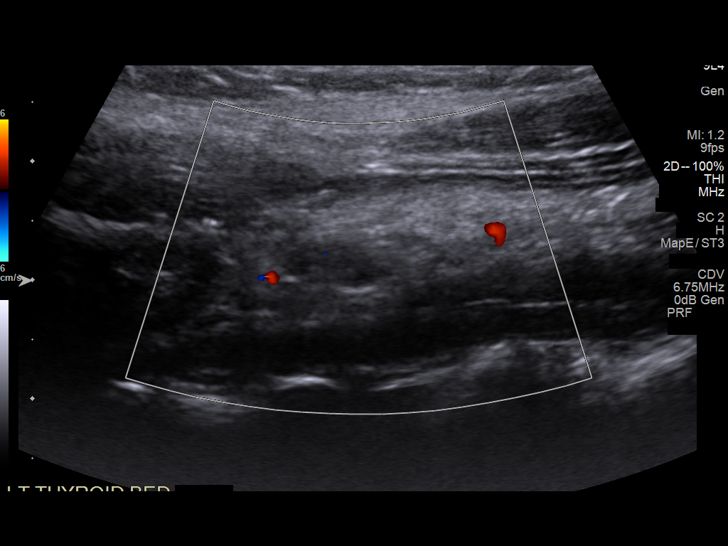
[im 42/56]
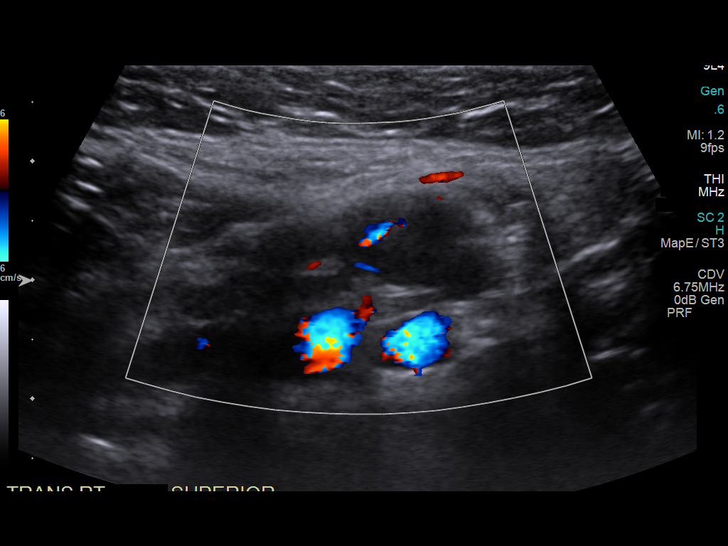
[im 46/56]
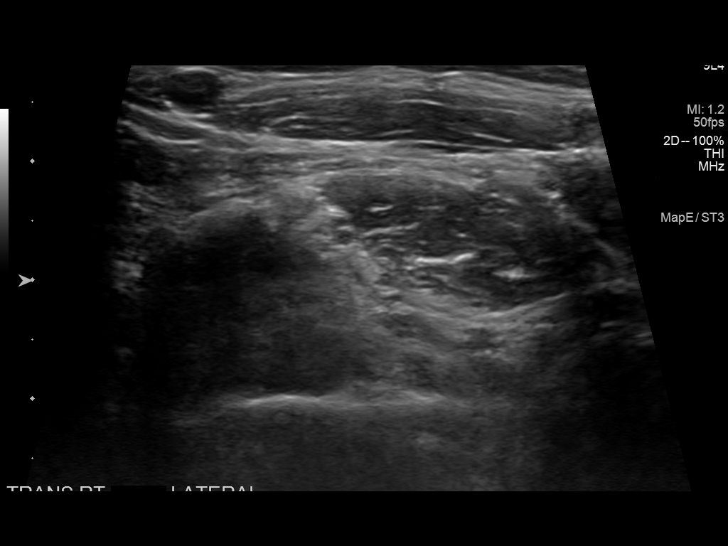
[im 51/56]
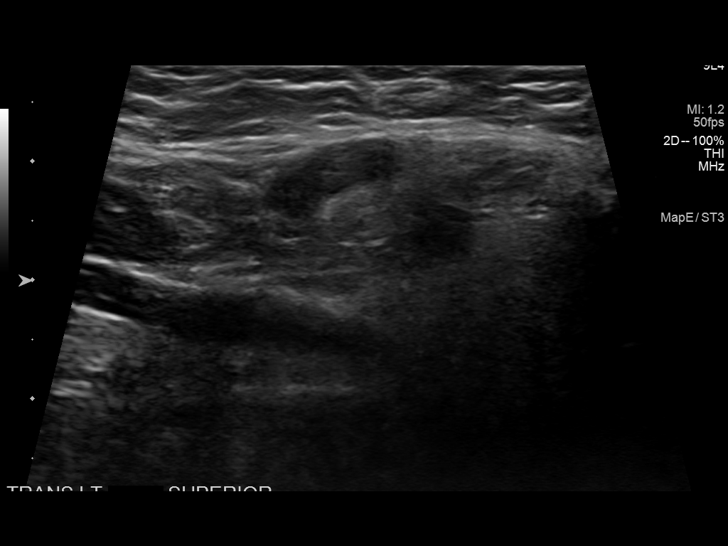
[im 56/56]
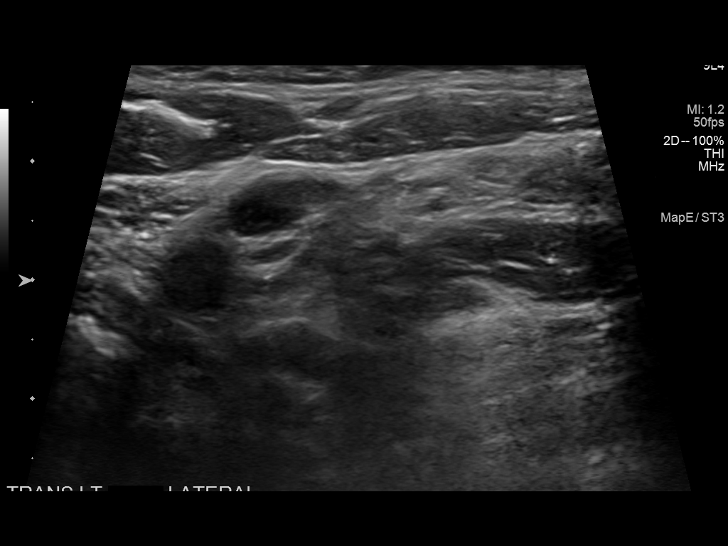

[14 of 25 positions shown; findings below may reference images not displayed]

FINDINGS: Remote thyroidectomy changes.

Similar right thyroid bed hypoechoic nodule measures 8 x 5 x 3 mm.

Questionable left thyroid bed similar 8 x 4 x 4 mm hypoechoic
nodule.

Certainly no new or enlarging thyroid bed abnormality or new
nodularity.

Benign cervical lymph nodes noted.  No definite bulky adenopathy.
IMPRESSION: Status post total thyroidectomy. Stable subcentimeter bilateral
thyroid bed hypoechoic indeterminate nodules as before.

No new or enlarging thyroid bed abnormality.

No bulky adenopathy.

The above is in keeping with the ACR TI-RADS recommendations - [HOSPITAL] 3352;[DATE].

## 2023-02-07 ENCOUNTER — Ambulatory Visit: Payer: 59 | Admitting: Internal Medicine
